# Patient Record
Sex: Female | Born: 1975
Health system: Southern US, Community
[De-identification: ages and names within clinical notes are randomized; demographics above are authoritative.]

## PROBLEM LIST (undated history)

## (undated) DIAGNOSIS — F32A Depression, unspecified: Secondary | ICD-10-CM

## (undated) DIAGNOSIS — R002 Palpitations: Secondary | ICD-10-CM

## (undated) DIAGNOSIS — Z8719 Personal history of other diseases of the digestive system: Secondary | ICD-10-CM

## (undated) DIAGNOSIS — E538 Deficiency of other specified B group vitamins: Secondary | ICD-10-CM

## (undated) DIAGNOSIS — N3010 Interstitial cystitis (chronic) without hematuria: Secondary | ICD-10-CM

## (undated) DIAGNOSIS — Z87442 Personal history of urinary calculi: Secondary | ICD-10-CM

## (undated) DIAGNOSIS — F329 Major depressive disorder, single episode, unspecified: Secondary | ICD-10-CM

## (undated) DIAGNOSIS — I6529 Occlusion and stenosis of unspecified carotid artery: Secondary | ICD-10-CM

## (undated) DIAGNOSIS — E669 Obesity, unspecified: Secondary | ICD-10-CM

## (undated) DIAGNOSIS — I999 Unspecified disorder of circulatory system: Secondary | ICD-10-CM

## (undated) DIAGNOSIS — K589 Irritable bowel syndrome without diarrhea: Secondary | ICD-10-CM

## (undated) DIAGNOSIS — K209 Esophagitis, unspecified without bleeding: Secondary | ICD-10-CM

## (undated) DIAGNOSIS — R531 Weakness: Secondary | ICD-10-CM

## (undated) DIAGNOSIS — K2940 Chronic atrophic gastritis without bleeding: Secondary | ICD-10-CM

## (undated) DIAGNOSIS — N83202 Unspecified ovarian cyst, left side: Secondary | ICD-10-CM

## (undated) DIAGNOSIS — R51 Headache: Secondary | ICD-10-CM

## (undated) DIAGNOSIS — G43909 Migraine, unspecified, not intractable, without status migrainosus: Secondary | ICD-10-CM

## (undated) DIAGNOSIS — I700 Atherosclerosis of aorta: Secondary | ICD-10-CM

## (undated) DIAGNOSIS — R29810 Facial weakness: Secondary | ICD-10-CM

## (undated) DIAGNOSIS — E785 Hyperlipidemia, unspecified: Secondary | ICD-10-CM

## (undated) DIAGNOSIS — K219 Gastro-esophageal reflux disease without esophagitis: Secondary | ICD-10-CM

## (undated) DIAGNOSIS — Z87898 Personal history of other specified conditions: Secondary | ICD-10-CM

## (undated) DIAGNOSIS — D510 Vitamin B12 deficiency anemia due to intrinsic factor deficiency: Secondary | ICD-10-CM

## (undated) HISTORY — PX: WISDOM TOOTH EXTRACTION: SHX21

## (undated) HISTORY — PX: TONSILLECTOMY: SUR1361

## (undated) HISTORY — DX: Chronic atrophic gastritis without bleeding: K29.40

## (undated) HISTORY — PX: TUBAL LIGATION: SHX77

## (undated) HISTORY — DX: Deficiency of other specified B group vitamins: E53.8

## (undated) HISTORY — DX: Headache: R51

## (undated) HISTORY — DX: Irritable bowel syndrome, unspecified: K58.9

---
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
## 2000-09-05 ENCOUNTER — Other Ambulatory Visit: Admission: RE | Admit: 2000-09-05 | Discharge: 2000-09-05 | Payer: Self-pay | Admitting: Otolaryngology

## 2001-01-24 ENCOUNTER — Ambulatory Visit (HOSPITAL_COMMUNITY): Admission: RE | Admit: 2001-01-24 | Discharge: 2001-01-24 | Payer: Self-pay | Admitting: Obstetrics and Gynecology

## 2001-01-24 ENCOUNTER — Encounter: Payer: Self-pay | Admitting: Obstetrics and Gynecology

## 2002-04-16 ENCOUNTER — Encounter: Payer: Self-pay | Admitting: Obstetrics and Gynecology

## 2002-04-16 ENCOUNTER — Ambulatory Visit (HOSPITAL_COMMUNITY): Admission: RE | Admit: 2002-04-16 | Discharge: 2002-04-16 | Payer: Self-pay | Admitting: Internal Medicine

## 2003-01-28 ENCOUNTER — Encounter: Payer: Self-pay | Admitting: Obstetrics and Gynecology

## 2003-01-28 ENCOUNTER — Ambulatory Visit (HOSPITAL_COMMUNITY): Admission: AD | Admit: 2003-01-28 | Discharge: 2003-01-28 | Payer: Self-pay | Admitting: Obstetrics and Gynecology

## 2003-01-31 ENCOUNTER — Ambulatory Visit (HOSPITAL_COMMUNITY): Admission: AD | Admit: 2003-01-31 | Discharge: 2003-01-31 | Payer: Self-pay | Admitting: Obstetrics and Gynecology

## 2003-02-04 ENCOUNTER — Ambulatory Visit (HOSPITAL_COMMUNITY): Admission: AD | Admit: 2003-02-04 | Discharge: 2003-02-04 | Payer: Self-pay | Admitting: Obstetrics and Gynecology

## 2003-02-07 ENCOUNTER — Ambulatory Visit (HOSPITAL_COMMUNITY): Admission: AD | Admit: 2003-02-07 | Discharge: 2003-02-07 | Payer: Self-pay | Admitting: Obstetrics and Gynecology

## 2003-02-11 ENCOUNTER — Ambulatory Visit (HOSPITAL_COMMUNITY): Admission: AD | Admit: 2003-02-11 | Discharge: 2003-02-11 | Payer: Self-pay | Admitting: Obstetrics and Gynecology

## 2003-02-14 ENCOUNTER — Ambulatory Visit (HOSPITAL_COMMUNITY): Admission: AD | Admit: 2003-02-14 | Discharge: 2003-02-14 | Payer: Self-pay | Admitting: Obstetrics and Gynecology

## 2003-02-19 ENCOUNTER — Ambulatory Visit (HOSPITAL_COMMUNITY): Admission: AD | Admit: 2003-02-19 | Discharge: 2003-02-19 | Payer: Self-pay | Admitting: Obstetrics and Gynecology

## 2003-02-22 ENCOUNTER — Ambulatory Visit (HOSPITAL_COMMUNITY): Admission: AD | Admit: 2003-02-22 | Discharge: 2003-02-22 | Payer: Self-pay | Admitting: Obstetrics and Gynecology

## 2003-02-26 ENCOUNTER — Ambulatory Visit (HOSPITAL_COMMUNITY): Admission: AD | Admit: 2003-02-26 | Discharge: 2003-02-26 | Payer: Self-pay | Admitting: Obstetrics and Gynecology

## 2003-03-01 ENCOUNTER — Ambulatory Visit (HOSPITAL_COMMUNITY): Admission: AD | Admit: 2003-03-01 | Discharge: 2003-03-01 | Payer: Self-pay | Admitting: Obstetrics and Gynecology

## 2003-03-05 ENCOUNTER — Ambulatory Visit (HOSPITAL_COMMUNITY): Admission: AD | Admit: 2003-03-05 | Discharge: 2003-03-05 | Payer: Self-pay | Admitting: Obstetrics and Gynecology

## 2003-03-07 ENCOUNTER — Ambulatory Visit (HOSPITAL_COMMUNITY): Admission: AD | Admit: 2003-03-07 | Discharge: 2003-03-07 | Payer: Self-pay | Admitting: Obstetrics and Gynecology

## 2003-03-08 ENCOUNTER — Ambulatory Visit (HOSPITAL_COMMUNITY): Admission: AD | Admit: 2003-03-08 | Discharge: 2003-03-08 | Payer: Self-pay | Admitting: Obstetrics and Gynecology

## 2003-03-11 ENCOUNTER — Ambulatory Visit (HOSPITAL_COMMUNITY): Admission: AD | Admit: 2003-03-11 | Discharge: 2003-03-11 | Payer: Self-pay | Admitting: Obstetrics and Gynecology

## 2003-03-15 ENCOUNTER — Ambulatory Visit (HOSPITAL_COMMUNITY): Admission: AD | Admit: 2003-03-15 | Discharge: 2003-03-15 | Payer: Self-pay | Admitting: Obstetrics and Gynecology

## 2003-03-19 ENCOUNTER — Ambulatory Visit (HOSPITAL_COMMUNITY): Admission: AD | Admit: 2003-03-19 | Discharge: 2003-03-19 | Payer: Self-pay | Admitting: Obstetrics and Gynecology

## 2003-03-21 ENCOUNTER — Ambulatory Visit (HOSPITAL_COMMUNITY): Admission: AD | Admit: 2003-03-21 | Discharge: 2003-03-21 | Payer: Self-pay | Admitting: Obstetrics and Gynecology

## 2003-03-26 ENCOUNTER — Ambulatory Visit (HOSPITAL_COMMUNITY): Admission: AD | Admit: 2003-03-26 | Discharge: 2003-03-26 | Payer: Self-pay | Admitting: Obstetrics and Gynecology

## 2003-03-29 ENCOUNTER — Ambulatory Visit (HOSPITAL_COMMUNITY): Admission: AD | Admit: 2003-03-29 | Discharge: 2003-03-29 | Payer: Self-pay | Admitting: Obstetrics and Gynecology

## 2003-04-02 ENCOUNTER — Inpatient Hospital Stay (HOSPITAL_COMMUNITY): Admission: AD | Admit: 2003-04-02 | Discharge: 2003-04-05 | Payer: Self-pay | Admitting: Obstetrics and Gynecology

## 2003-05-02 ENCOUNTER — Ambulatory Visit (HOSPITAL_COMMUNITY): Admission: RE | Admit: 2003-05-02 | Discharge: 2003-05-02 | Payer: Self-pay | Admitting: Obstetrics and Gynecology

## 2003-07-06 HISTORY — PX: ABDOMINAL HYSTERECTOMY: SHX81

## 2003-07-11 ENCOUNTER — Ambulatory Visit (HOSPITAL_COMMUNITY): Admission: RE | Admit: 2003-07-11 | Discharge: 2003-07-11 | Payer: Self-pay | Admitting: Obstetrics & Gynecology

## 2003-09-05 ENCOUNTER — Inpatient Hospital Stay (HOSPITAL_COMMUNITY): Admission: RE | Admit: 2003-09-05 | Discharge: 2003-09-07 | Payer: Self-pay | Admitting: Obstetrics & Gynecology

## 2006-08-10 ENCOUNTER — Ambulatory Visit (HOSPITAL_COMMUNITY): Admission: RE | Admit: 2006-08-10 | Discharge: 2006-08-10 | Payer: Self-pay | Admitting: General Surgery

## 2006-10-21 ENCOUNTER — Ambulatory Visit (HOSPITAL_COMMUNITY): Admission: RE | Admit: 2006-10-21 | Discharge: 2006-10-21 | Payer: Self-pay | Admitting: Obstetrics and Gynecology

## 2011-10-21 ENCOUNTER — Other Ambulatory Visit (HOSPITAL_COMMUNITY): Payer: Self-pay | Admitting: Family Medicine

## 2011-10-21 ENCOUNTER — Ambulatory Visit (HOSPITAL_COMMUNITY)
Admission: RE | Admit: 2011-10-21 | Discharge: 2011-10-21 | Disposition: A | Discharge status: Home / Self Care | Payer: Managed Care, Other (non HMO) | Source: Ambulatory Visit | Attending: Family Medicine | Admitting: Family Medicine

## 2011-10-21 DIAGNOSIS — R079 Chest pain, unspecified: Secondary | ICD-10-CM

## 2012-02-27 ENCOUNTER — Emergency Department (HOSPITAL_COMMUNITY): Payer: Managed Care, Other (non HMO)

## 2012-02-27 ENCOUNTER — Inpatient Hospital Stay (HOSPITAL_COMMUNITY)
Admission: EM | Admit: 2012-02-27 | Discharge: 2012-03-01 | DRG: 392 | Disposition: A | Discharge status: Home / Self Care | Payer: Managed Care, Other (non HMO) | Attending: Internal Medicine | Admitting: Internal Medicine

## 2012-02-27 ENCOUNTER — Encounter (HOSPITAL_COMMUNITY): Payer: Self-pay | Admitting: Emergency Medicine

## 2012-02-27 DIAGNOSIS — Z800 Family history of malignant neoplasm of digestive organs: Secondary | ICD-10-CM

## 2012-02-27 DIAGNOSIS — F17200 Nicotine dependence, unspecified, uncomplicated: Secondary | ICD-10-CM | POA: Diagnosis present

## 2012-02-27 DIAGNOSIS — Z720 Tobacco use: Secondary | ICD-10-CM | POA: Diagnosis present

## 2012-02-27 DIAGNOSIS — Z90710 Acquired absence of both cervix and uterus: Secondary | ICD-10-CM

## 2012-02-27 DIAGNOSIS — E669 Obesity, unspecified: Secondary | ICD-10-CM | POA: Diagnosis present

## 2012-02-27 DIAGNOSIS — K219 Gastro-esophageal reflux disease without esophagitis: Secondary | ICD-10-CM | POA: Diagnosis present

## 2012-02-27 DIAGNOSIS — K5289 Other specified noninfective gastroenteritis and colitis: Secondary | ICD-10-CM

## 2012-02-27 DIAGNOSIS — E785 Hyperlipidemia, unspecified: Secondary | ICD-10-CM | POA: Diagnosis present

## 2012-02-27 DIAGNOSIS — K529 Noninfective gastroenteritis and colitis, unspecified: Secondary | ICD-10-CM | POA: Diagnosis present

## 2012-02-27 DIAGNOSIS — Z6832 Body mass index (BMI) 32.0-32.9, adult: Secondary | ICD-10-CM

## 2012-02-27 DIAGNOSIS — A09 Infectious gastroenteritis and colitis, unspecified: Principal | ICD-10-CM | POA: Diagnosis present

## 2012-02-27 HISTORY — DX: Hyperlipidemia, unspecified: E78.5

## 2012-02-27 HISTORY — DX: Gastro-esophageal reflux disease without esophagitis: K21.9

## 2012-02-27 LAB — CBC WITH DIFFERENTIAL/PLATELET
Basophils Absolute: 0 K/uL (ref 0.0–0.1)
Basophils Relative: 0 % (ref 0–1)
Eosinophils Absolute: 0 K/uL (ref 0.0–0.7)
Eosinophils Relative: 0 % (ref 0–5)
HCT: 46.5 % — ABNORMAL HIGH (ref 36.0–46.0)
Hemoglobin: 16.4 g/dL — ABNORMAL HIGH (ref 12.0–15.0)
Lymphocytes Relative: 18 % (ref 12–46)
Lymphs Abs: 1.8 K/uL (ref 0.7–4.0)
MCH: 32.5 pg (ref 26.0–34.0)
MCHC: 35.3 g/dL (ref 30.0–36.0)
MCV: 92.1 fL (ref 78.0–100.0)
Monocytes Absolute: 0.6 K/uL (ref 0.1–1.0)
Monocytes Relative: 6 % (ref 3–12)
Neutro Abs: 7.7 K/uL (ref 1.7–7.7)
Neutrophils Relative %: 77 % (ref 43–77)
Platelets: 184 K/uL (ref 150–400)
RBC: 5.05 MIL/uL (ref 3.87–5.11)
RDW: 12.4 % (ref 11.5–15.5)
WBC: 10.1 K/uL (ref 4.0–10.5)

## 2012-02-27 LAB — LIPASE, BLOOD: Lipase: 18 U/L (ref 11–59)

## 2012-02-27 LAB — COMPREHENSIVE METABOLIC PANEL WITH GFR
ALT: 12 U/L (ref 0–35)
AST: 14 U/L (ref 0–37)
Albumin: 4.2 g/dL (ref 3.5–5.2)
Alkaline Phosphatase: 84 U/L (ref 39–117)
BUN: 4 mg/dL — ABNORMAL LOW (ref 6–23)
CO2: 26 meq/L (ref 19–32)
Calcium: 10.3 mg/dL (ref 8.4–10.5)
Chloride: 98 meq/L (ref 96–112)
Creatinine, Ser: 0.71 mg/dL (ref 0.50–1.10)
GFR calc Af Amer: >90 mL/min
GFR calc non Af Amer: >90 mL/min
Glucose, Bld: 97 mg/dL (ref 70–99)
Potassium: 3.5 meq/L (ref 3.5–5.1)
Sodium: 134 meq/L — ABNORMAL LOW (ref 135–145)
Total Bilirubin: 0.4 mg/dL (ref 0.3–1.2)
Total Protein: 8.1 g/dL (ref 6.0–8.3)

## 2012-02-27 LAB — BILIRUBIN, DIRECT: Bilirubin, Direct: 0.1 mg/dL (ref 0.0–0.3)

## 2012-02-27 LAB — CLOSTRIDIUM DIFFICILE BY PCR: Toxigenic C. Difficile by PCR: NEGATIVE

## 2012-02-27 MED ORDER — SODIUM CHLORIDE 0.9 % IV SOLN
1000.0000 mL | Freq: Once | INTRAVENOUS | Status: AC
  Administered 2012-02-27: 1000 mL via INTRAVENOUS

## 2012-02-27 MED ORDER — ENOXAPARIN SODIUM 40 MG/0.4ML ~~LOC~~ SOLN
40.0000 mg | SUBCUTANEOUS | Status: DC
  Administered 2012-02-27: 40 mg via SUBCUTANEOUS
  Filled 2012-02-27 (×2): qty 0.4

## 2012-02-27 MED ORDER — ACETAMINOPHEN 325 MG PO TABS
650.0000 mg | ORAL_TABLET | Freq: Once | ORAL | Status: AC
  Administered 2012-02-27: 650 mg via ORAL
  Filled 2012-02-27: qty 2

## 2012-02-27 MED ORDER — ONDANSETRON HCL 4 MG/2ML IJ SOLN
4.0000 mg | Freq: Once | INTRAMUSCULAR | Status: AC
  Administered 2012-02-27: 4 mg via INTRAVENOUS
  Filled 2012-02-27: qty 2

## 2012-02-27 MED ORDER — TRAZODONE HCL 50 MG PO TABS: 25.0000 mg | ORAL_TABLET | Freq: Every evening | ORAL | Status: DC | PRN

## 2012-02-27 MED ORDER — POTASSIUM CHLORIDE IN NACL 20-0.9 MEQ/L-% IV SOLN
INTRAVENOUS | Status: DC
  Administered 2012-02-27 – 2012-02-29 (×3): via INTRAVENOUS
  Administered 2012-02-29 – 2012-03-01 (×2): 1000 mL via INTRAVENOUS

## 2012-02-27 MED ORDER — SODIUM CHLORIDE 0.9 % IV SOLN
1000.0000 mL | INTRAVENOUS | Status: DC
  Administered 2012-02-27: 1000 mL via INTRAVENOUS

## 2012-02-27 MED ORDER — CIPROFLOXACIN IN D5W 400 MG/200ML IV SOLN
400.0000 mg | Freq: Once | INTRAVENOUS | Status: AC
  Administered 2012-02-27: 400 mg via INTRAVENOUS
  Filled 2012-02-27: qty 200

## 2012-02-27 MED ORDER — SODIUM CHLORIDE 0.9 % IV SOLN: INTRAVENOUS | Status: DC

## 2012-02-27 MED ORDER — ONDANSETRON HCL 4 MG/2ML IJ SOLN
4.0000 mg | INTRAMUSCULAR | Status: DC | PRN
  Administered 2012-02-28: 4 mg via INTRAVENOUS
  Filled 2012-02-27: qty 2

## 2012-02-27 MED ORDER — ONDANSETRON HCL 4 MG/2ML IJ SOLN: 4.0000 mg | Freq: Three times a day (TID) | INTRAMUSCULAR | Status: DC | PRN

## 2012-02-27 MED ORDER — ACETAMINOPHEN 650 MG RE SUPP: 650.0000 mg | Freq: Four times a day (QID) | RECTAL | Status: DC | PRN

## 2012-02-27 MED ORDER — ACETAMINOPHEN 325 MG PO TABS
650.0000 mg | ORAL_TABLET | ORAL | Status: DC | PRN
  Administered 2012-02-28 – 2012-02-29 (×2): 650 mg via ORAL
  Filled 2012-02-27 (×2): qty 2

## 2012-02-27 MED ORDER — HYDROMORPHONE HCL PF 1 MG/ML IJ SOLN
1.0000 mg | Freq: Once | INTRAMUSCULAR | Status: AC
  Administered 2012-02-27: 1 mg via INTRAVENOUS
  Filled 2012-02-27: qty 1

## 2012-02-27 MED ORDER — IOHEXOL 300 MG/ML  SOLN
100.0000 mL | Freq: Once | INTRAMUSCULAR | Status: AC | PRN
  Administered 2012-02-27: 100 mL via INTRAVENOUS

## 2012-02-27 MED ORDER — METRONIDAZOLE IN NACL 5-0.79 MG/ML-% IV SOLN
500.0000 mg | Freq: Three times a day (TID) | INTRAVENOUS | Status: DC
  Administered 2012-02-28 – 2012-02-29 (×4): 500 mg via INTRAVENOUS
  Filled 2012-02-27 (×5): qty 100

## 2012-02-27 MED ORDER — HYDROMORPHONE HCL PF 1 MG/ML IJ SOLN
0.5000 mg | INTRAMUSCULAR | Status: DC | PRN
  Administered 2012-02-27 – 2012-02-29 (×7): 1 mg via INTRAVENOUS
  Filled 2012-02-27 (×7): qty 1

## 2012-02-27 MED ORDER — CIPROFLOXACIN IN D5W 400 MG/200ML IV SOLN
400.0000 mg | Freq: Two times a day (BID) | INTRAVENOUS | Status: DC
  Administered 2012-02-28 (×2): 400 mg via INTRAVENOUS
  Filled 2012-02-27 (×3): qty 200

## 2012-02-27 MED ORDER — PANTOPRAZOLE SODIUM 40 MG PO TBEC
40.0000 mg | DELAYED_RELEASE_TABLET | Freq: Every day | ORAL | Status: DC
  Administered 2012-02-27 – 2012-02-29 (×3): 40 mg via ORAL
  Filled 2012-02-27 (×3): qty 1

## 2012-02-27 MED ORDER — METRONIDAZOLE IN NACL 5-0.79 MG/ML-% IV SOLN
500.0000 mg | Freq: Once | INTRAVENOUS | Status: AC
  Administered 2012-02-27: 500 mg via INTRAVENOUS
  Filled 2012-02-27: qty 100

## 2012-02-27 NOTE — ED Notes (Signed)
Pt c/o lower abdominal pain, diarrhea, and nausea since Thursday. Pt describes pain as "severe cramping". Pt also presents with fever but states she was unaware of fever before arrival. Pt states  Her son had similar symptoms 1 week ago.

## 2012-02-27 NOTE — H&P (Signed)
[facility] History and Physical  Vanessa Lamb  EAV:[id number]  DOB: 06-Jan-1976   DOA: 02/27/2012   PCP:   Cassell Smiles., MD   Chief Complaint:  Cramping abdominal pains and diarrhea for 2 days  HPI: Vanessa Lamb is an 36 y.o. female.   Obese Caucasian lady with self diagnosed GERD, but no other past gastrointestinal history since with the above symptoms. Diarrhea is now become blood streaked. She has had no recent travel, no household contacts with diarrhea, and because the pain has gotten significantly worse he presents to the emergency room for evaluation.  CT scan in the emergency room shows inflammation of descending and sigmoid colon; rectal involvement is not described.   Rewiew of Systems:   All systems negative except as marked bold or noted in the HPI;  Constitutional: Negative for malaise, fever and chills. ;  Eyes: Negative for eye pain, redness and discharge. ;  ENMT: Negative for ear pain, hoarseness, nasal congestion, sinus pressure and sore throat. ;  Cardiovascular: Negative for chest pain, palpitations, diaphoresis, dyspnea and peripheral edema. ;  Respiratory: Negative for cough, hemoptysis, wheezing and stridor. ;  Gastrointestinal: Negative for  vomiting, , constipation,, hematemesis, jaundice and rectal bleeding. unusual weight loss..melena; nausea,diarrhea blood in stool,abdominal pain.    Genitourinary: Negative for frequency, dysuria, incontinence,flank pain and hematuria; Musculoskeletal: Negative for back pain and neck pain. Negative for swelling and trauma.;  Skin: . Negative for pruritus, rash, abrasions, bruising and skin lesion.; ulcerations Neuro: Negative for headache, lightheadedness and neck stiffness. Negative for weakness, altered level of consciousness , altered mental status, extremity weakness, burning feet, involuntary movement, seizure and syncope.  Psych: negative for anxiety, depression, insomnia, tearfulness, panic attacks,  hallucinations, paranoia, suicidal or homicidal ideation.   Past Medical History  Diagnosis Date  . Hyperlipemia   . GERD (gastroesophageal reflux disease)     Past Surgical History  Procedure Date  . Abdominal hysterectomy   . Tubal ligation   . Wisdom tooth extraction     Medications:  HOME MEDS: Prior to Admission medications   Not on File  Occasional takes Rolaids   Allergies:  No Known Allergies  Social History:   reports that she has been smoking.  She does not have any smokeless tobacco history on file. She reports that she does not drink alcohol or use illicit drugs. smokes one pack per day for an average of 10 years  Family History: Family History  Problem Relation Age of Onset  . Hypertension Mother   . Hypertension Father   . Cancer Maternal Grandfather     colon  . Ulcers Father     PUD  . Colonic polyp Son 9     Physical Exam: Filed Vitals:   02/27/12 1732 02/27/12 2008  BP: 126/85 116/71  Pulse: 137 111  Temp: 102 F (38.9 C) 100.5 F (38.1 C)  TempSrc: Oral Oral  Resp: 18 20  Height: 5\' 4"  (1.626 m)   Weight: 81.647 kg (180 lb)   SpO2: 99% 99%   Blood pressure 116/71, pulse 111, temperature 100.5 F (38.1 C), temperature source Oral, resp. rate 20, height 5\' 4"  (1.626 m), weight 81.647 kg (180 lb), SpO2 99.00%.  GEN:  Pleasant overweight Caucasian lady lying in the stretcher distressed by abdominal pain; cooperative with exam PSYCH:  alert and oriented x4;  affect is appropriate. HEENT: Mucous membranes pink, dry, and anicteric; PERRLA; EOM intact; no cervical lymphadenopathy nor thyromegaly or carotid bruit; no JVD; Breasts::  Not examined CHEST WALL: No tenderness CHEST: Normal respiration, clear to auscultation bilaterally HEART: Regular rate and rhythm; no murmurs rubs or gallops BACK: No kyphosis or scoliosis; no CVA tenderness ABDOMEN: Obese, soft diffuse abdominal tenderness; no masses, no organomegaly, normal abdominal bowel  sounds; ; no intertriginous candida. Rectal Exam: Not done EXTREMITIES: No bone or joint deformity; no edema; no ulcerations. Genitalia: not examined PULSES: 2+ and symmetric SKIN: Normal hydration no rash or ulceration CNS: Cranial nerves 2-12 grossly intact no focal lateralizing neurologic deficit   Labs on Admission:  Basic Metabolic Panel:  Lab 02/27/12 1610  NA 134*  K 3.5  CL 98  CO2 26  GLUCOSE 97  BUN 4*  CREATININE 0.71  CALCIUM 10.3  MG --  PHOS --   Liver Function Tests:  Lab 02/27/12 1755  AST 14  ALT 12  ALKPHOS 84  BILITOT 0.4  PROT 8.1  ALBUMIN 4.2    Lab 02/27/12 1755  LIPASE 18  AMYLASE --   No results found for this basename: AMMONIA:5 in the last 168 hours CBC:  Lab 02/27/12 1755  WBC 10.1  NEUTROABS 7.7  HGB 16.4*  HCT 46.5*  MCV 92.1  PLT 184   Cardiac Enzymes: No results found for this basename: CKTOTAL:5,CKMB:5,CKMBINDEX:5,TROPONINI:5 in the last 168 hours BNP: No components found with this basename: POCBNP:5 CBG: No results found for this basename: GLUCAP:5 in the last 168 hours  Results for orders placed during the hospital encounter of 02/27/12 (from the past 48 hour(s))  COMPREHENSIVE METABOLIC PANEL     Status: Abnormal   Collection Time   02/27/12  5:55 PM      Component Value Range Comment   Sodium 134 (*) 135 - 145 mEq/L    Potassium 3.5  3.5 - 5.1 mEq/L    Chloride 98  96 - 112 mEq/L    CO2 26  19 - 32 mEq/L    Glucose, Bld 97  70 - 99 mg/dL    BUN 4 (*) 6 - 23 mg/dL    Creatinine, Ser 9.60  0.50 - 1.10 mg/dL    Calcium 45.4  8.4 - 10.5 mg/dL    Total Protein 8.1  6.0 - 8.3 g/dL    Albumin 4.2  3.5 - 5.2 g/dL    AST 14  0 - 37 U/L    ALT 12  0 - 35 U/L    Alkaline Phosphatase 84  39 - 117 U/L    Total Bilirubin 0.4  0.3 - 1.2 mg/dL    GFR calc non Af Amer >90  >90 mL/min    GFR calc Af Amer >90  >90 mL/min   CBC WITH DIFFERENTIAL     Status: Abnormal   Collection Time   02/27/12  5:55 PM      Component Value  Range Comment   WBC 10.1  4.0 - 10.5 K/uL    RBC 5.05  3.87 - 5.11 MIL/uL    Hemoglobin 16.4 (*) 12.0 - 15.0 g/dL    HCT 09.8 (*) 11.9 - 46.0 %    MCV 92.1  78.0 - 100.0 fL    MCH 32.5  26.0 - 34.0 pg    MCHC 35.3  30.0 - 36.0 g/dL    RDW 14.7  82.9 - 56.2 %    Platelets 184  150 - 400 K/uL    Neutrophils Relative 77  43 - 77 %    Neutro Abs 7.7  1.7 - 7.7 K/uL  Lymphocytes Relative 18  12 - 46 %    Lymphs Abs 1.8  0.7 - 4.0 K/uL    Monocytes Relative 6  3 - 12 %    Monocytes Absolute 0.6  0.1 - 1.0 K/uL    Eosinophils Relative 0  0 - 5 %    Eosinophils Absolute 0.0  0.0 - 0.7 K/uL    Basophils Relative 0  0 - 1 %    Basophils Absolute 0.0  0.0 - 0.1 K/uL   LIPASE, BLOOD     Status: Normal   Collection Time   02/27/12  5:55 PM      Component Value Range Comment   Lipase 18  11 - 59 U/L      Radiological Exams on Admission: Ct Abdomen Pelvis W Contrast  02/27/2012  *RADIOLOGY REPORT*  Clinical Data: Lower abdominal pain and diarrhea for 3 days.  CT ABDOMEN AND PELVIS WITH CONTRAST  Technique:  Multidetector CT imaging of the abdomen and pelvis was performed following the standard protocol during bolus administration of intravenous contrast.  Contrast: OMNIPAQUE IOHEXOL 300 MG/ML  SOLN  Comparison: Renal ultrasound 11/03/2010.  Findings: Mild dependent atelectasis is seen in the lung bases.  No pleural or pericardial effusion.  The liver, gallbladder, adrenal glands, spleen, pancreas and left kidney are unremarkable.  Small low attenuating lesion in the lower pole right kidney is compatible with a cyst and as seen on the comparison study.  The patient is status post hysterectomy.  The stomach and small bowel are unremarkable.  The colon is largely decompressed with the walls of the descending and sigmoid colon thickened with mild pericolonic inflammatory change.  No pneumatosis, portal venous gas, free air or fluid collection is identified.  IMPRESSION: Findings consistent with  colitis of the descending and sigmoid, either infectious or inflammatory.   Original Report Authenticated By: Bernadene Bell. Maricela Curet, M.D.      Assessment/Plan Present on Admission:  .Acute colitis .Hyperlipidemia .Obesity .GERD (gastroesophageal reflux disease) Tobacco abuse  PLAN: We'll admit this lady for hydration, and intravenous antibiotic treatment for presumed infectious colitis; We'll also check a C. Difficile, although she has no risk factors, and because of the current high prevalence in the community.  Consul on weight reduction and tobacco cessation. Counsel on medication compliance -since she reports noncompliance with Zocor  Other plans as per orders.  Code Status: FULL CODE  Family Communication: Mother, Vanessa Lamb, [phone] Rexene Edison [phone]) Disposition Plan:     Vanessa Lamb Nocturnist [facility] Pager [phone]   02/27/2012, 8:40 PM

## 2012-02-27 NOTE — ED Provider Notes (Signed)
History     CSN: [id number]  Arrival date & time 02/27/12  1701   First MD Initiated Contact with Patient 02/27/12 1743      Chief Complaint  Patient presents with  . Abdominal Pain  . Diarrhea    (Consider location/radiation/quality/duration/timing/severity/associated sxs/prior treatment) HPI Patient with diarrhea followed by abdominal pain and fever for two days.  Nausea but patient denies vomiting. She describes copious diarrhea which has now taken on some pinkish tinge. She is also complaining of severe lower abdominal cramping. She has not been out of the country, camping, or had any known sick contacts. Past Medical History  Diagnosis Date  . Hyperlipemia     Past Surgical History  Procedure Date  . Abdominal hysterectomy   . Tubal ligation   . Wisdom tooth extraction     No family history on file.  History  Substance Use Topics  . Smoking status: Never Smoker   . Smokeless tobacco: Not on file  . Alcohol Use: No    OB History    Grav Para Term Preterm Abortions TAB SAB Ect Mult Living                  Review of Systems  All other systems reviewed and are negative.    Allergies  Review of patient's allergies indicates no known allergies.  Home Medications  No current outpatient prescriptions on file.  BP 126/85  Pulse 137  Temp 102 F (38.9 C) (Oral)  Resp 18  Ht 5\' 4"  (1.626 m)  Wt 180 lb (81.647 kg)  BMI 30.90 kg/m2  SpO2 99%  Physical Exam  Nursing note and vitals reviewed. Constitutional: She appears well-developed and well-nourished.  HENT:  Head: Normocephalic and atraumatic.  Eyes: Conjunctivae and EOM are normal. Pupils are equal, round, and reactive to light.  Neck: Normal range of motion. Neck supple.  Cardiovascular: Regular rhythm, normal heart sounds and intact distal pulses.  Tachycardia present.   Pulmonary/Chest: Effort normal and breath sounds normal.  Abdominal: Soft. Bowel sounds are normal.       Left lower  abdominal pain and suprapubic tenderness.  Musculoskeletal: Normal range of motion.  Neurological: She is alert.  Skin: Skin is warm and dry.  Psychiatric: She has a normal mood and affect. Thought content normal.    ED Course  Procedures (including critical care time)  Labs Reviewed  COMPREHENSIVE METABOLIC PANEL - Abnormal; Notable for the following:    Sodium 134 (*)     BUN 4 (*)     All other components within normal limits  CBC WITH DIFFERENTIAL - Abnormal; Notable for the following:    Hemoglobin 16.4 (*)     HCT 46.5 (*)     All other components within normal limits  LIPASE, BLOOD   Ct Abdomen Pelvis W Contrast  02/27/2012  *RADIOLOGY REPORT*  Clinical Data: Lower abdominal pain and diarrhea for 3 days.  CT ABDOMEN AND PELVIS WITH CONTRAST  Technique:  Multidetector CT imaging of the abdomen and pelvis was performed following the standard protocol during bolus administration of intravenous contrast.  Contrast: OMNIPAQUE IOHEXOL 300 MG/ML  SOLN  Comparison: Renal ultrasound 11/03/2010.  Findings: Mild dependent atelectasis is seen in the lung bases.  No pleural or pericardial effusion.  The liver, gallbladder, adrenal glands, spleen, pancreas and left kidney are unremarkable.  Small low attenuating lesion in the lower pole right kidney is compatible with a cyst and as seen on the comparison study.  The patient is status post hysterectomy.  The stomach and small bowel are unremarkable.  The colon is largely decompressed with the walls of the descending and sigmoid colon thickened with mild pericolonic inflammatory change.  No pneumatosis, portal venous gas, free air or fluid collection is identified.  IMPRESSION: Findings consistent with colitis of the descending and sigmoid, either infectious or inflammatory.   Original Report Authenticated By: Bernadene Bell. Maricela Curet, M.D.      No diagnosis found.  The patient received 2 L of normal saline, Tylenol, a milligram of Dilaudid, and  Zofran. After review of her CT scan she also received Cipro and Flagyl.  MDM  Patient continues to complain of diarrhea. She was initially very tachycardic and febrile. Plan admission for continued hydration and antibiotic treatment.        Hilario Quarry, MD 03/01/12 [phone]

## 2012-02-27 NOTE — ED Notes (Signed)
Patient with c/o of diarrhea, fever, and vomiting x 2 days. Patient c/o lower abdominal cramping.

## 2012-02-28 ENCOUNTER — Encounter (HOSPITAL_COMMUNITY): Payer: Self-pay

## 2012-02-28 DIAGNOSIS — Z720 Tobacco use: Secondary | ICD-10-CM | POA: Diagnosis present

## 2012-02-28 LAB — CBC
Hemoglobin: 12.9 g/dL (ref 12.0–15.0)
MCH: 31.8 pg (ref 26.0–34.0)
MCHC: 34.3 g/dL (ref 30.0–36.0)
Platelets: 150 10*3/uL (ref 150–400)
RBC: 4.03 MIL/uL (ref 3.87–5.11)

## 2012-02-28 LAB — BASIC METABOLIC PANEL
Calcium: 8.7 mg/dL (ref 8.4–10.5)
GFR calc Af Amer: >90 mL/min (ref >90)
GFR calc non Af Amer: >90 mL/min (ref >90)
Glucose, Bld: 103 mg/dL — ABNORMAL HIGH (ref 70–99)
Potassium: 3.7 mEq/L (ref 3.5–5.1)
Sodium: 138 mEq/L (ref 135–145)

## 2012-02-28 MED ORDER — NICOTINE 21 MG/24HR TD PT24
21.0000 mg | MEDICATED_PATCH | Freq: Every day | TRANSDERMAL | Status: DC | PRN
  Administered 2012-02-29: 21 mg via TRANSDERMAL
  Filled 2012-02-28: qty 1

## 2012-02-28 MED ORDER — METRONIDAZOLE IN NACL 5-0.79 MG/ML-% IV SOLN
INTRAVENOUS | Status: AC
  Filled 2012-02-28: qty 100

## 2012-02-28 MED ORDER — SODIUM CHLORIDE 0.9 % IJ SOLN
INTRAMUSCULAR | Status: AC
  Filled 2012-02-28: qty 3

## 2012-02-28 NOTE — Progress Notes (Signed)
UR Chart Review Completed  

## 2012-02-28 NOTE — Progress Notes (Signed)
Subjective: This lady was admitted with presumed infectious colitis, descending colon. She feels somewhat better. She is still having diarrhea, no nausea or vomiting. Abdominal pain is not particularly impressive. C. difficile toxin is negative. She is on intravenous antibiotics.           Physical Exam: Blood pressure 111/81, pulse 115, temperature 98.3 F (36.8 C), temperature source Oral, resp. rate 20, height 5\' 4"  (1.626 m), weight 81.647 kg (180 lb), SpO2 99.00%. She looks systemically well. She is not toxic or septic. Her abdomen is soft and mildly tender in the left lower quadrant. Bowel sounds are heard. Heart sounds are present and normal. Lung fields are clear. She is alert and orientated.   Investigations:  Recent Results (from the past 240 hour(s))  CLOSTRIDIUM DIFFICILE BY PCR     Status: Normal   Collection Time   02/27/12  8:14 PM      Component Value Range Status Comment   C difficile by pcr NEGATIVE  NEGATIVE Final      Basic Metabolic Panel:  Basename 02/28/12 0512 02/27/12 1755  NA 138 134*  K 3.7 3.5  CL 107 98  CO2 23 26  GLUCOSE 103* 97  BUN 5* 4*  CREATININE 0.82 0.71  CALCIUM 8.7 10.3  MG 1.7 --  PHOS -- --   Liver Function Tests:  Basename 02/27/12 1755  AST 14  ALT 12  ALKPHOS 84  BILITOT 0.4  PROT 8.1  ALBUMIN 4.2     CBC:  Basename 02/28/12 0512 02/27/12 1755  WBC 7.8 10.1  NEUTROABS -- 7.7  HGB 12.9 16.4*  HCT 37.3 46.5*  MCV 92.6 92.1  PLT 150 184    Ct Abdomen Pelvis W Contrast  02/27/2012  *RADIOLOGY REPORT*  Clinical Data: Lower abdominal pain and diarrhea for 3 days.  CT ABDOMEN AND PELVIS WITH CONTRAST  Technique:  Multidetector CT imaging of the abdomen and pelvis was performed following the standard protocol during bolus administration of intravenous contrast.  Contrast: OMNIPAQUE IOHEXOL 300 MG/ML  SOLN  Comparison: Renal ultrasound 11/03/2010.  Findings: Mild dependent atelectasis is seen in the  lung bases.  No pleural or pericardial effusion.  The liver, gallbladder, adrenal glands, spleen, pancreas and left kidney are unremarkable.  Small low attenuating lesion in the lower pole right kidney is compatible with a cyst and as seen on the comparison study.  The patient is status post hysterectomy.  The stomach and small bowel are unremarkable.  The colon is largely decompressed with the walls of the descending and sigmoid colon thickened with mild pericolonic inflammatory change.  No pneumatosis, portal venous gas, free air or fluid collection is identified.  IMPRESSION: Findings consistent with colitis of the descending and sigmoid, either infectious or inflammatory.   Original Report Authenticated By: Bernadene Bell. Maricela Curet, M.D.       Medications:  Scheduled:   . sodium chloride  1,000 mL Intravenous Once   Followed by  . sodium chloride  1,000 mL Intravenous Once  . acetaminophen  650 mg Oral Once  . ciprofloxacin  400 mg Intravenous Once  . ciprofloxacin  400 mg Intravenous Q12H  . enoxaparin (LOVENOX) injection  40 mg Subcutaneous Q24H  .  HYDROmorphone (DILAUDID) injection  1 mg Intravenous Once  . metronidazole  500 mg Intravenous Once  . metronidazole  500 mg Intravenous Q8H  . ondansetron (ZOFRAN) IV  4 mg Intravenous Once  . pantoprazole  40 mg Oral Q1200  .  DISCONTD: sodium chloride   Intravenous STAT    Impression: 1. Presumed infectious descending colitis. 2. Obesity. 3. History of GERD.     Plan: 1. Continue with intravenous antibiotics. 2. Continue intravenous fluids. 3. Advance diet as tolerated. 4. Hopefully, she can be discharged home soon.     LOS: 1 day   Wilson Singer Pager [phone]  02/28/2012, 7:39 AM

## 2012-02-28 NOTE — Care Management Note (Signed)
    Page 1 of 1   03/01/2012     10:39:12 AM   CARE MANAGEMENT NOTE 03/01/2012  Patient:  Vanessa Lamb, Vanessa Lamb   Account Number:  [id number]  Date Initiated:  02/28/2012  Documentation initiated by:  Rosemary Holms  Subjective/Objective Assessment:   Pt admitted from home with acute colitis. Very independent with ADL.     Action/Plan:   No HH needs identified   Anticipated DC Date:  03/01/2012   Anticipated DC Plan:  HOME/SELF CARE      DC Planning Services  CM consult      Choice offered to / List presented to:             Status of service:  Completed, signed off Medicare Important Message given?   (If response is "NO", the following Medicare IM given date fields will be blank) Date Medicare IM given:   Date Additional Medicare IM given:    Discharge Disposition:  HOME/SELF CARE  Per UR Regulation:    If discussed at Long Length of Stay Meetings, dates discussed:    Comments:  02/28/12 1100 Shawnique Mariotti Leanord Hawking RN BSN CM

## 2012-02-29 LAB — BASIC METABOLIC PANEL
BUN: <3 mg/dL — ABNORMAL LOW (ref 6–23)
Chloride: 109 mEq/L (ref 96–112)
Creatinine, Ser: 0.62 mg/dL (ref 0.50–1.10)
Glucose, Bld: 104 mg/dL — ABNORMAL HIGH (ref 70–99)
Potassium: 3.8 mEq/L (ref 3.5–5.1)

## 2012-02-29 MED ORDER — CIPROFLOXACIN HCL 250 MG PO TABS
500.0000 mg | ORAL_TABLET | Freq: Two times a day (BID) | ORAL | Status: DC
  Administered 2012-02-29 – 2012-03-01 (×3): 500 mg via ORAL
  Filled 2012-02-29 (×3): qty 2

## 2012-02-29 MED ORDER — METRONIDAZOLE 500 MG PO TABS
500.0000 mg | ORAL_TABLET | Freq: Three times a day (TID) | ORAL | Status: DC
  Administered 2012-02-29 (×3): 500 mg via ORAL
  Filled 2012-02-29 (×3): qty 1

## 2012-02-29 NOTE — Progress Notes (Signed)
Subjective: This lady was admitted with presumed infectious colitis, descending colon. She feels  better. She is still having diarrhea, no nausea or vomiting. Abdominal pain is not particularly impressive. C. difficile toxin is negative. She is on intravenous antibiotics.           Physical Exam: Blood pressure 96/68, pulse 80, temperature 98.3 F (36.8 C), temperature source Oral, resp. rate 18, height 5\' 4"  (1.626 m), weight 85.7 kg (188 lb 15 oz), SpO2 98.00%. She looks systemically well. She is not toxic or septic. Her abdomen is soft and mildly tender in the left lower quadrant. Bowel sounds are heard. Heart sounds are present and normal. Lung fields are clear. She is alert and orientated.   Investigations:  Recent Results (from the past 240 hour(s))  CLOSTRIDIUM DIFFICILE BY PCR     Status: Normal   Collection Time   02/27/12  8:14 PM      Component Value Range Status Comment   C difficile by pcr NEGATIVE  NEGATIVE Final      Basic Metabolic Panel:  Basename 02/29/12 0551 02/28/12 0512  NA 139 138  K 3.8 3.7  CL 109 107  CO2 24 23  GLUCOSE 104* 103*  BUN <3* 5*  CREATININE 0.62 0.82  CALCIUM 8.8 8.7  MG -- 1.7  PHOS -- --   Liver Function Tests:  [facility] 02/27/12 1755  AST 14  ALT 12  ALKPHOS 84  BILITOT 0.4  PROT 8.1  ALBUMIN 4.2     CBC:  Basename 02/28/12 0512 02/27/12 1755  WBC 7.8 10.1  NEUTROABS -- 7.7  HGB 12.9 16.4*  HCT 37.3 46.5*  MCV 92.6 92.1  PLT 150 184    Ct Abdomen Pelvis W Contrast  02/27/2012  *RADIOLOGY REPORT*  Clinical Data: Lower abdominal pain and diarrhea for 3 days.  CT ABDOMEN AND PELVIS WITH CONTRAST  Technique:  Multidetector CT imaging of the abdomen and pelvis was performed following the standard protocol during bolus administration of intravenous contrast.  Contrast: OMNIPAQUE IOHEXOL 300 MG/ML  SOLN  Comparison: Renal ultrasound 11/03/2010.  Findings: Mild dependent atelectasis is seen in the lung  bases.  No pleural or pericardial effusion.  The liver, gallbladder, adrenal glands, spleen, pancreas and left kidney are unremarkable.  Small low attenuating lesion in the lower pole right kidney is compatible with a cyst and as seen on the comparison study.  The patient is status post hysterectomy.  The stomach and small bowel are unremarkable.  The colon is largely decompressed with the walls of the descending and sigmoid colon thickened with mild pericolonic inflammatory change.  No pneumatosis, portal venous gas, free air or fluid collection is identified.  IMPRESSION: Findings consistent with colitis of the descending and sigmoid, either infectious or inflammatory.   Original Report Authenticated By: Bernadene Bell. Maricela Curet, M.D.       Medications:  Scheduled:    . ciprofloxacin  500 mg Oral BID  . enoxaparin (LOVENOX) injection  40 mg Subcutaneous Q24H  . metroNIDAZOLE  500 mg Oral TID  . pantoprazole  40 mg Oral Q1200  . sodium chloride      . DISCONTD: ciprofloxacin  400 mg Intravenous Q12H  . DISCONTD: metronidazole  500 mg Intravenous Q8H    Impression: 1. Presumed infectious descending colitis. 2. Obesity. 3. History of GERD.     Plan: 1. Switch intravenous antibiotics to oral. 2. Advance diet. 3. She tolerates diet today as well as the oral  antibiotics, I think she is safe to be discharged home. Her white blood cell count is not elevated and she does not have a fever.     LOS: 2 days   Wilson Singer Pager [phone]  02/29/2012, 7:52 AM

## 2012-02-29 NOTE — Progress Notes (Signed)
Pt c/o gas with no relief. Dr. Karilyn Cota paged at [phone] in order to receive order for anti-gas medication. No return page at this time. Will continue to monitor.

## 2012-03-01 DIAGNOSIS — F17200 Nicotine dependence, unspecified, uncomplicated: Secondary | ICD-10-CM

## 2012-03-01 MED ORDER — CIPROFLOXACIN HCL 500 MG PO TABS: 500.0000 mg | ORAL_TABLET | Freq: Two times a day (BID) | ORAL | Status: DC

## 2012-03-01 MED ORDER — NICOTINE 21 MG/24HR TD PT24: 1.0000 | MEDICATED_PATCH | Freq: Every day | TRANSDERMAL | Status: DC | PRN

## 2012-03-01 MED ORDER — METRONIDAZOLE 500 MG PO TABS: 500.0000 mg | ORAL_TABLET | Freq: Three times a day (TID) | ORAL | Status: DC

## 2012-03-01 NOTE — Plan of Care (Signed)
Problem: Discharge Progression Outcomes Goal: Other Discharge Outcomes/Goals Outcome: Completed/Met Date Met:  03/01/12 Discharge instructions read to pt .  Pt verbalized understanding of all instructions Discharged to home with friend

## 2012-03-01 NOTE — Discharge Summary (Signed)
Physician Discharge Summary  Vanessa Lamb NWG:[id number] DOB: Jun 11, 1976 DOA: 02/27/2012  PCP: Cassell Smiles., MD  Admit date: 02/27/2012 Discharge date: 03/01/2012  Recommendations for Outpatient Follow-up:  1. Follow up primary care physician in approximately one week.   Discharge Diagnoses:  1. Acute descending presumed infectious colitis, improving. C. difficile toxin negative. No evidence of inflammatory bowel disease, clinically. 2. Obesity. 3. GERD. 4. Tobacco abuse.   Discharge Condition: Stable and improved.  Diet recommendation: Advise as she tolerates.  Filed Weights   02/27/12 1732 02/29/12 0143 03/01/12 0647  Weight: 81.647 kg (180 lb) 85.7 kg (188 lb 15 oz) 85.2 kg (187 lb 13.3 oz)    History of present illness:  This 36 year old lady was admitted with symptoms of cramping abdominal pain and diarrhea for 2 days prior to hospitalization. There is no history of rectal bleeding, no significant nausea or vomiting. CT scan of the abdomen in the emergency room showed inflammation of the descending and sigmoid colon.  Hospital Course:  She was admitted and started on intravenous fluids and intravenous antibiotics. She had a very good response. C. difficile toxin was negative. She did not have a fever. Her white blood cell count was not acutely increased. Yesterday she was switched to oral antibiotics and she has tolerated these well. She is also tolerating a diet without any vomiting. She still has diarrhea but this is much improved. She does have some cramping abdominal pain but this is tolerable.  Procedures:  None.  Consultations:  None.  Discharge Exam: Filed Vitals:   03/01/12 0549  BP: 105/72  Pulse: 83  Temp: 97.9 F (36.6 C)  Resp: 18   Filed Vitals:   02/29/12 1805 02/29/12 2049 03/01/12 0549 03/01/12 0647  BP: 100/71 145/81 105/72   Pulse: 76 79 83   Temp: 98.4 F (36.9 C) 98 F (36.7 C) 97.9 F (36.6 C)   TempSrc: Oral Oral Oral   Resp: 18 18  18    Height:      Weight:    85.2 kg (187 lb 13.3 oz)  SpO2: 100% 100% 99%     General: She looks systemically well. She is not toxic or septic. Cardiovascular: Heart sounds are present and normal without murmurs. Respiratory: Lung fields are clear. Abdomen soft and mildly tender in the left lower quadrant but less so than yesterday.  Discharge Instructions  Discharge Orders    Future Orders Please Complete By Expires   Diet - low sodium heart healthy      Increase activity slowly        Medication List  As of 03/01/2012  8:26 AM   TAKE these medications         ciprofloxacin 500 MG tablet   Commonly known as: CIPRO   Take 1 tablet (500 mg total) by mouth 2 (two) times daily.      metroNIDAZOLE 500 MG tablet   Commonly known as: FLAGYL   Take 1 tablet (500 mg total) by mouth 3 (three) times daily.      nicotine 21 mg/24hr patch   Commonly known as: NICODERM CQ - dosed in mg/24 hours   Place 1 patch onto the skin daily as needed (nicotine withdrawal).              The results of significant diagnostics from this hospitalization (including imaging, microbiology, ancillary and laboratory) are listed below for reference.    Significant Diagnostic Studies: Ct Abdomen Pelvis W Contrast  02/27/2012  *RADIOLOGY REPORT*  Clinical Data: Lower abdominal pain and diarrhea for 3 days.  CT ABDOMEN AND PELVIS WITH CONTRAST  Technique:  Multidetector CT imaging of the abdomen and pelvis was performed following the standard protocol during bolus administration of intravenous contrast.  Contrast: OMNIPAQUE IOHEXOL 300 MG/ML  SOLN  Comparison: Renal ultrasound 11/03/2010.  Findings: Mild dependent atelectasis is seen in the lung bases.  No pleural or pericardial effusion.  The liver, gallbladder, adrenal glands, spleen, pancreas and left kidney are unremarkable.  Small low attenuating lesion in the lower pole right kidney is compatible with a cyst and as seen on the comparison study.   The patient is status post hysterectomy.  The stomach and small bowel are unremarkable.  The colon is largely decompressed with the walls of the descending and sigmoid colon thickened with mild pericolonic inflammatory change.  No pneumatosis, portal venous gas, free air or fluid collection is identified.  IMPRESSION: Findings consistent with colitis of the descending and sigmoid, either infectious or inflammatory.   Original Report Authenticated By: Bernadene Bell. Maricela Curet, M.D.     Microbiology: Recent Results (from the past 240 hour(s))  CLOSTRIDIUM DIFFICILE BY PCR     Status: Normal   Collection Time   02/27/12  8:14 PM      Component Value Range Status Comment   C difficile by pcr NEGATIVE  NEGATIVE Final   STOOL CULTURE     Status: Normal (Preliminary result)   Collection Time   02/28/12  2:00 AM      Component Value Range Status Comment   Specimen Description STOOL   Final    Special Requests NONE   Final    Culture Culture reincubated for better growth   Final    Report Status PENDING   Incomplete      Labs: Basic Metabolic Panel:  Lab 02/29/12 0454 02/28/12 0512 02/27/12 1755  NA 139 138 134*  K 3.8 3.7 3.5  CL 109 107 98  CO2 24 23 26   GLUCOSE 104* 103* 97  BUN <3* 5* 4*  CREATININE 0.62 0.82 0.71  CALCIUM 8.8 8.7 10.3  MG -- 1.7 --  PHOS -- -- --   Liver Function Tests:  Lab 02/27/12 1755  AST 14  ALT 12  ALKPHOS 84  BILITOT 0.4  PROT 8.1  ALBUMIN 4.2    Lab 02/27/12 1755  LIPASE 18  AMYLASE --    CBC:  Lab 02/28/12 0512 02/27/12 1755  WBC 7.8 10.1  NEUTROABS -- 7.7  HGB 12.9 16.4*  HCT 37.3 46.5*  MCV 92.6 92.1  PLT 150 184      Time coordinating discharge: Greater than 30 minutes  Signed:  Samira Acero C  [facility] 03/01/2012, 8:26 AM

## 2012-03-01 NOTE — Progress Notes (Signed)
Patient is afraid that she will be back with the same thing. She is wondering if it might have something to do with her B12 deficiency. She also wonders if she has IBS. She states she feels the same way she did when she was admitted, she stated she feels pressure and as if everything is smoshed together, nothing has changed. She is also supposed to be taking zocor, B12 shots monthly, & estradol. She states she hasn't taken them because she cant remember.

## 2012-03-03 LAB — STOOL CULTURE

## 2012-03-07 NOTE — Progress Notes (Signed)
UR Chart Review Completed  

## 2012-03-09 ENCOUNTER — Ambulatory Visit (INDEPENDENT_AMBULATORY_CARE_PROVIDER_SITE_OTHER): Payer: Managed Care, Other (non HMO) | Admitting: Urgent Care

## 2012-03-09 ENCOUNTER — Encounter (HOSPITAL_COMMUNITY): Payer: Self-pay | Admitting: Pharmacy Technician

## 2012-03-09 ENCOUNTER — Other Ambulatory Visit: Payer: Self-pay

## 2012-03-09 ENCOUNTER — Encounter: Payer: Self-pay | Admitting: Urgent Care

## 2012-03-09 VITALS — BP 121/87 | HR 86 | Temp 97.8°F | Ht 64.0 in | Wt 184.4 lb

## 2012-03-09 DIAGNOSIS — R197 Diarrhea, unspecified: Secondary | ICD-10-CM

## 2012-03-09 DIAGNOSIS — K219 Gastro-esophageal reflux disease without esophagitis: Secondary | ICD-10-CM

## 2012-03-09 DIAGNOSIS — K5289 Other specified noninfective gastroenteritis and colitis: Secondary | ICD-10-CM

## 2012-03-09 DIAGNOSIS — R110 Nausea: Secondary | ICD-10-CM | POA: Insufficient documentation

## 2012-03-09 DIAGNOSIS — K529 Noninfective gastroenteritis and colitis, unspecified: Secondary | ICD-10-CM | POA: Insufficient documentation

## 2012-03-09 MED ORDER — PEG-KCL-NACL-NASULF-NA ASC-C 100 G PO SOLR: 1.0000 | ORAL | Status: DC

## 2012-03-09 MED ORDER — OMEPRAZOLE 20 MG PO CPDR: 20.0000 mg | DELAYED_RELEASE_CAPSULE | Freq: Every day | ORAL | Status: DC

## 2012-03-09 NOTE — Patient Instructions (Addendum)
Omeprazole 20 mg daily for acid reflux 30 minutes before breakfast 1-800-quit-now for help quitting smoking You need a colonoscopy & EGD with Dr. Darrick Penna as soon as possible Call if severe pain, bleeding or dehydration or go to the emergency department.

## 2012-03-09 NOTE — Assessment & Plan Note (Signed)
Vanessa Lamb is a pleasant 36 y.o. female with lifelong history of IBS-type symptoms including urgency.  More recently she was admitted with acute colitis. CT is in stool culture negative. She has completed a course of Cipro and Flagyl, however she has persistent pain. She has had bloody diarrhea as well. We need to rule out inflammatory bowel disease. She has persistent nausea and will undergo EGD at the same time of colonoscopy.  EGD and colonoscopy with Dr. Darrick Penna.  I have discussed risks & benefits which include, but are not limited to, bleeding, infection, perforation & drug reaction.  The patient agrees with this plan & written consent will be obtained.

## 2012-03-09 NOTE — Progress Notes (Signed)
Faxed to PCP

## 2012-03-09 NOTE — Progress Notes (Signed)
Referring Provider: Cassell Smiles., MD Primary Care Physician:  Cassell Smiles., MD Primary Gastroenterologist:  Dr. Jonette Eva  Chief Complaint  Patient presents with  . Gas  . Abdominal Pain  . Ulcerative Colitis    HPI:  Vanessa Lamb is a 36 y.o. female here as a referral from Dr. Sherwood Gambler for abdominal pain and colitis. On July 4, she began to have incontinence with loose stools.  She gives history of self diagnosed irritable bowel syndrome. She tells me her IBS-type symptoms have worsened over the last 2 months. She has had very painful gas.  Cannot tolerate dairy products.  TNTC diarrhea & felt like bottom was gonna fall out.  She was admitted to the hospital in August and completed Cipro and Flagyl after CT scan showed colitis of the descending and sigmoid colon.  However, she continues to have problems.  c/o nausea & being "sick" after every time she eats.  Bowel movements have improved to 3 per day recently but still loose and mucousy.  Denies rectal bleeding or melena currently, but did notice scant bright red blood in her stool while in the hospital.  Denies vomiting.  She notes "terrible" indigsstion.  She has been eating Rolaids on a regular basis. She also takes an occasional IBU 2 per day, and denies any other NSAIDs.     Results reviewed from recent hospitalization 8/25: Sodium 134, otherwise normal CMP. Hemoglobin 16.4, otherwise normal CBC. Lipase normal. Magnesium normal. C. difficile and stool culture are normal 8/25 CT of abdomen and pelvis with IV contrast- colitis of the descending and sigmoid, either infectious or inflammatory.  Past Medical History  Diagnosis Date  . Hyperlipemia   . GERD (gastroesophageal reflux disease)   . IBS (irritable bowel syndrome)   . B12 deficiency     Past Surgical History  Procedure Date  . Abdominal hysterectomy 2005    complete  . Tubal ligation   . Wisdom tooth extraction     Current Outpatient Prescriptions  Medication  Sig Dispense Refill  . omeprazole (PRILOSEC) 20 MG capsule Take 1 capsule (20 mg total) by mouth daily.  31 capsule  1  . peg 3350 powder (MOVIPREP) 100 G SOLR Take 1 kit (100 g total) by mouth as directed.  1 kit  0    Allergies as of 03/09/2012  . (No Known Allergies)    Family History  Problem Relation Age of Onset  . Hypertension Mother   . Hypertension Father   . Ulcers Father     PUD  . Colon cancer Maternal Grandfather 27  . Colonic polyp Son 9  . Diabetes Father   . Thyroid cancer Mother     History   Social History  . Marital Status: Single    Spouse Name: N/A    Number of Children: 2  . Years of Education: N/A   Occupational History  . machine op [employer]   Social History Main Topics  . Smoking status: Current Everyday Smoker -- 1.0 packs/day for 10 years    Types: Cigarettes  . Smokeless tobacco: Not on file  . Alcohol Use: No  . Drug Use: No  . Sexually Active: Not on file   Other Topics Concern  . Not on file   Social History Narrative  . No narrative on file    Review of Systems: Gen: See history of present illness  CV: Denies chest pain, angina, palpitations, syncope, orthopnea, PND, peripheral edema, and claudication. Resp: Denies  dyspnea at rest, dyspnea with exercise, cough, sputum, wheezing, coughing up blood, and pleurisy. GI: Denies vomiting blood, jaundice, and fecal incontinence.   Denies dysphagia or odynophagia. GU : Denies urinary burning, blood in urine, urinary frequency, urinary hesitancy, nocturnal urination, and urinary incontinence. MS: Denies joint pain, limitation of movement, and swelling, stiffness, low back pain, extremity pain. Denies muscle weakness, cramps, atrophy.  Derm: Denies rash, itching, dry skin, hives, moles, warts, or unhealing ulcers.  Psych: Denies depression, anxiety, memory loss, suicidal ideation, hallucinations, paranoia, and confusion. Heme: Denies bruising, bleeding, and enlarged lymph  nodes. Neuro:  Denies any headaches, dizziness, paresthesias. Endo:  Denies any problems with DM, thyroid, adrenal function.  Physical Exam: BP 121/87  Pulse 86  Temp 97.8 F (36.6 C) (Temporal)  Ht 5\' 4"  (1.626 m)  Wt 184 lb 6.4 oz (83.643 kg)  BMI 31.65 kg/m2 No LMP recorded. Patient has had a hysterectomy. General:   Alert,  Well-developed, well-nourished, pleasant and cooperative in NAD Head:  Normocephalic and atraumatic. Eyes:  Sclera clear, no icterus.   Conjunctiva pink. Ears:  Normal auditory acuity. Nose:  No deformity, discharge, or lesions. Mouth:  No deformity or lesions,oropharynx pink & moist. Neck:  Supple; no masses or thyromegaly. Lungs:  Clear throughout to auscultation.   No wheezes, crackles, or rhonchi. No acute distress. Heart:  Regular rate and rhythm; no murmurs, clicks, rubs,  or gallops. Abdomen:  Umbilical ornament. Normal bowel sounds.  No bruits.  Soft and non-distended.  + Mild left lower quadrant and mid abdominal pain on palpation. without masses, hepatosplenomegaly or hernias noted.  No guarding or rebound tenderness.   Rectal:  Deferred. Msk:  Symmetrical without gross deformities. Normal posture. Pulses:  Normal pulses noted. Extremities:  No clubbing or edema. Neurologic:  Alert and oriented x4;  grossly normal neurologically. Skin:  Multiple tattoos. Intact without significant lesions or rashes. Lymph Nodes:  No significant cervical adenopathy. Psych:  Alert and cooperative. Normal mood and affect.

## 2012-03-09 NOTE — Assessment & Plan Note (Signed)
IBS versus IBD.  Call if severe pain, bleeding or dehydration or go to the emergency department. Further recommendations to follow procedures.

## 2012-03-09 NOTE — Assessment & Plan Note (Signed)
Begin omeprazole 20 mg daily 30 minutes before breakfast.

## 2012-03-09 NOTE — Assessment & Plan Note (Signed)
EGD. Question inflammatory bowel disease versus GERD or peptic ulcer disease, less likely.

## 2012-03-15 ENCOUNTER — Encounter (HOSPITAL_COMMUNITY): Payer: Self-pay | Admitting: *Deleted

## 2012-03-15 ENCOUNTER — Ambulatory Visit (HOSPITAL_COMMUNITY)
Admission: RE | Admit: 2012-03-15 | Discharge: 2012-03-15 | Disposition: A | Discharge status: Home / Self Care | Payer: Managed Care, Other (non HMO) | Source: Ambulatory Visit | Attending: Gastroenterology | Admitting: Gastroenterology

## 2012-03-15 ENCOUNTER — Encounter (HOSPITAL_COMMUNITY)
Admission: RE | Disposition: A | Discharge status: Home / Self Care | Payer: Self-pay | Source: Ambulatory Visit | Attending: Gastroenterology

## 2012-03-15 DIAGNOSIS — K209 Esophagitis, unspecified without bleeding: Secondary | ICD-10-CM

## 2012-03-15 DIAGNOSIS — R110 Nausea: Secondary | ICD-10-CM

## 2012-03-15 DIAGNOSIS — K921 Melena: Secondary | ICD-10-CM | POA: Insufficient documentation

## 2012-03-15 DIAGNOSIS — K2990 Gastroduodenitis, unspecified, without bleeding: Secondary | ICD-10-CM

## 2012-03-15 DIAGNOSIS — K529 Noninfective gastroenteritis and colitis, unspecified: Secondary | ICD-10-CM

## 2012-03-15 DIAGNOSIS — R197 Diarrhea, unspecified: Secondary | ICD-10-CM

## 2012-03-15 DIAGNOSIS — K2970 Gastritis, unspecified, without bleeding: Secondary | ICD-10-CM

## 2012-03-15 DIAGNOSIS — K2940 Chronic atrophic gastritis without bleeding: Secondary | ICD-10-CM | POA: Insufficient documentation

## 2012-03-15 DIAGNOSIS — R1013 Epigastric pain: Secondary | ICD-10-CM

## 2012-03-15 DIAGNOSIS — K648 Other hemorrhoids: Secondary | ICD-10-CM | POA: Insufficient documentation

## 2012-03-15 DIAGNOSIS — K3189 Other diseases of stomach and duodenum: Secondary | ICD-10-CM

## 2012-03-15 DIAGNOSIS — K219 Gastro-esophageal reflux disease without esophagitis: Secondary | ICD-10-CM

## 2012-03-15 DIAGNOSIS — E785 Hyperlipidemia, unspecified: Secondary | ICD-10-CM | POA: Insufficient documentation

## 2012-03-15 DIAGNOSIS — R109 Unspecified abdominal pain: Secondary | ICD-10-CM

## 2012-03-15 DIAGNOSIS — K449 Diaphragmatic hernia without obstruction or gangrene: Secondary | ICD-10-CM | POA: Insufficient documentation

## 2012-03-15 DIAGNOSIS — R12 Heartburn: Secondary | ICD-10-CM | POA: Insufficient documentation

## 2012-03-15 HISTORY — PX: ESOPHAGOGASTRODUODENOSCOPY: SHX5428

## 2012-03-15 HISTORY — PX: COLONOSCOPY: SHX5424

## 2012-03-15 SURGERY — COLONOSCOPY
Anesthesia: Moderate Sedation

## 2012-03-15 MED ORDER — SODIUM CHLORIDE 0.45 % IV SOLN
INTRAVENOUS | Status: DC
  Administered 2012-03-15: 10:00:00 via INTRAVENOUS

## 2012-03-15 MED ORDER — OMEPRAZOLE 20 MG PO CPDR: DELAYED_RELEASE_CAPSULE | ORAL | Status: DC

## 2012-03-15 MED ORDER — PROMETHAZINE HCL 25 MG/ML IJ SOLN
INTRAMUSCULAR | Status: DC | PRN
  Administered 2012-03-15: 12.5 mg via INTRAVENOUS

## 2012-03-15 MED ORDER — SODIUM CHLORIDE 0.9 % IJ SOLN
INTRAMUSCULAR | Status: AC
  Filled 2012-03-15: qty 10

## 2012-03-15 MED ORDER — MEPERIDINE HCL 100 MG/ML IJ SOLN
INTRAMUSCULAR | Status: DC | PRN
  Administered 2012-03-15 (×2): 25 mg via INTRAVENOUS
  Administered 2012-03-15: 50 mg via INTRAVENOUS
  Administered 2012-03-15: 25 mg via INTRAVENOUS

## 2012-03-15 MED ORDER — MIDAZOLAM HCL 5 MG/5ML IJ SOLN
INTRAMUSCULAR | Status: DC | PRN
  Administered 2012-03-15: 1 mg via INTRAVENOUS
  Administered 2012-03-15 (×2): 2 mg via INTRAVENOUS
  Administered 2012-03-15 (×2): 1 mg via INTRAVENOUS
  Administered 2012-03-15: 2 mg via INTRAVENOUS

## 2012-03-15 MED ORDER — MIDAZOLAM HCL 5 MG/5ML IJ SOLN
INTRAMUSCULAR | Status: AC
  Filled 2012-03-15: qty 10

## 2012-03-15 MED ORDER — ALIGN 4 MG PO CAPS: 4.0000 mg | ORAL_CAPSULE | Freq: Every day | ORAL | Status: DC

## 2012-03-15 MED ORDER — PROMETHAZINE HCL 25 MG/ML IJ SOLN
INTRAMUSCULAR | Status: AC
  Filled 2012-03-15: qty 1

## 2012-03-15 MED ORDER — MEPERIDINE HCL 100 MG/ML IJ SOLN
INTRAMUSCULAR | Status: AC
  Filled 2012-03-15: qty 2

## 2012-03-15 MED ORDER — STERILE WATER FOR IRRIGATION IR SOLN
Status: DC | PRN
  Administered 2012-03-15: 10:00:00

## 2012-03-15 NOTE — Op Note (Signed)
[facility] [address] [place], [address]   ENDOSCOPY PROCEDURE REPORT  PATIENT: Vanessa Lamb, Vanessa Lamb  MR#: [id number] BIRTHDATE: [birth year] , 36  yrs. old GENDER: Female  ENDOSCOPIST: Jonette Eva, MD REFERRED XB:[id number] Sherwood Gambler, M.D.  PROCEDURE DATE: 03/15/2012 PROCEDURE:   EGD w/ biopsy  INDICATIONS:new onset dyspepsia, heartburn, diarrhea. MEDICATIONS: TCS + NONE TOPICAL ANESTHETIC:   Cetacaine Spray  DESCRIPTION OF PROCEDURE:     Physical exam was performed.  Informed consent was obtained from the patient after explaining the benefits, risks, and alternatives to the procedure.  The patient was connected to the monitor and placed in the left lateral position.  Continuous oxygen was provided by nasal cannula and IV medicine administered through an indwelling cannula.  After administration of sedation, the patients esophagus was intubated and the EC-3890Li ([id number]) and Pentax Gastroscope [id number] endoscope was advanced under direct visualization to the second portion of the duodenum.  The scope was removed slowly by carefully examining the color, texture, anatomy, and integrity of the mucosa on the way out.  The patient was recovered in endoscopy and discharged home in satisfactory condition.      ESOPHAGUS: Esophagitis IN DISTAL ESOPHAGUS.  NO BARRETT'S. A 3 cm hiatal hernia was noted.  STOMACH: Non-erosive gastritis (inflammation) was found in the gastric antrum.  Multiple biopsies were performed using cold forceps.  DUODENUM: The duodenal mucosa showed no abnormalities.  Cold forcep biopsies were taken in the second portion.  COMPLICATIONS:   None  ENDOSCOPIC IMPRESSION: 1.   Esophagitis, MILD 2.  SMALL hiatal hernia 3.   Non-erosive gastritis  RECOMMENDATIONS: 1.  await biopsy results 2.  high fiber/dairy free/low fat diet OMEPRAZOLE BID FOR 3 MOS THEN ONCE DAILY lose 10-20 lbs stop smoking opv in 2 mos w/ SLF   REPEAT  EXAM:   _______________________________ Rosalie DoctorJonette Eva, MD 03/15/2012 11:29 AM       PATIENT NAME:  Vanessa Lamb, Vanessa Lamb MR#: [id number]

## 2012-03-15 NOTE — Op Note (Addendum)
[facility] [address] [place], [address]   COLONOSCOPY PROCEDURE REPORT  PATIENT: Vanessa Lamb, Vanessa Lamb  MR#: [id number] BIRTHDATE: 11-15-1975 , 36  yrs. old GENDER: Female ENDOSCOPIST: Jonette Eva, MD REFERRED AO:ZHYQM Sherwood Gambler, M.D. PROCEDURE DATE:  03/15/2012 PROCEDURE:   ILEOColonoscopy with biopsy INDICATIONS:bloody diarrhea, abdominal pain, CT AUG 2013: dc/[place] colitis. MEDICATIONS: Demerol 125 mg IV, Versed 9 mg IV, and Promethazine (Phenergan) 12.5 mg IV  DESCRIPTION OF PROCEDURE:    Physical exam was performed.  Informed consent was obtained from the patient after explaining the benefits, risks, and alternatives to procedure.  The patient was connected to monitor and placed in left lateral position. Continuous oxygen was provided by nasal cannula and IV medicine administered through an indwelling cannula.  After administration of sedation and rectal exam, the patients rectum was intubated and the EC-3890Li ([id number])  colonoscope was advanced under direct visualization to the ileum.  The scope was removed slowly by carefully examining the color, texture, anatomy, and integrity mucosa on the way out.  The patient was recovered in endoscopy and discharged home in satisfactory condition.       COLON FINDINGS: The mucosa appeared normal in the terminal ileum.  10-15 cm visualized., The colonic mucosa appeared normal. Multiple random biopsies of the area were performed.  Cytology was performed.  , and Small internal hemorrhoids were found.  PREP QUALITY: excellent. CECAL W/D TIME: 9 minutes  COMPLICATIONS: None  ENDOSCOPIC IMPRESSION: 1.   Normal mucosa in the terminal ileum 2.   The colonic mucosa appeared normal; 3.   Small internal hemorrhoids 4.  NO OBVIOUS SOURCE FOR DIARRHEA AND ABD PAIN IDENTIFIED   RECOMMENDATIONS: 1.  await biopsy results 2.  HigH/LACTOSE FREE DIET 3. TCS AT AGE 27 4. OPV IN 2  MOS       _______________________________ eSignedJonette Eva, MD 03/15/2012 11:41 AM Revised: 03/15/2012 11:41 AM    PATIENT NAME:  Vanessa Lamb, Vanessa Lamb MR#: [id number]

## 2012-03-15 NOTE — H&P (Signed)
  Primary Care Physician:  Cassell Smiles., MD Primary Gastroenterologist:  Dr. Darrick Penna  Pre-Procedure History & Physical: HPI:  Vanessa Lamb is a 36 y.o. female here for  DIARRHEA/ABDOMINAL PAIN. CT AUG 25 SHOWED DEFINITE EVIDENCE OF [place]/DC COLITIS. I PERSONALLY REVIEWED CT WITH RADIOLOGY.  Past Medical History  Diagnosis Date  . Hyperlipemia   . GERD (gastroesophageal reflux disease)   . IBS (irritable bowel syndrome)   . B12 deficiency     Past Surgical History  Procedure Date  . Abdominal hysterectomy 2005    complete  . Tubal ligation   . Wisdom tooth extraction     Prior to Admission medications   Medication Sig Start Date End Date Taking? Authorizing Provider  ibuprofen (ADVIL,MOTRIN) 200 MG tablet Take 400 mg by mouth every 6 (six) hours as needed. For pain   Yes Historical Provider, MD  omeprazole (PRILOSEC) 20 MG capsule Take 1 capsule (20 mg total) by mouth daily. 03/09/12 03/09/13  Joselyn Arrow, NP    Allergies as of 03/09/2012  . (No Known Allergies)    Family History  Problem Relation Age of Onset  . Hypertension Mother   . Hypertension Father   . Ulcers Father     PUD  . Colon cancer Maternal Grandfather 41  . Colonic polyp Son 9  . Diabetes Father   . Thyroid cancer Mother     History   Social History  . Marital Status: Single    Spouse Name: N/A    Number of Children: 2  . Years of Education: N/A   Occupational History  . machine op [employer]   Social History Main Topics  . Smoking status: Current Every Day Smoker -- 1.0 packs/day for 10 years    Types: Cigarettes  . Smokeless tobacco: Not on file  . Alcohol Use: No  . Drug Use: No  . Sexually Active: Not on file   Other Topics Concern  . Not on file   Social History Narrative  . No narrative on file    Review of Systems: See HPI, otherwise negative ROS   Physical Exam: BP 113/76  Pulse 85  Temp 98.1 F (36.7 C) (Oral)  Resp 18  Ht 5\' 4"  (1.626 m)  Wt 184 lb  (83.462 kg)  BMI 31.58 kg/m2  SpO2 98% General:   Alert,  pleasant and cooperative in NAD Head:  Normocephalic and atraumatic. Neck:  Supple; Lungs:  Clear throughout to auscultation.    Heart:  Regular rate and rhythm. Abdomen:  Soft, nontender and nondistended. Normal bowel sounds, without guarding, and without rebound.   Neurologic:  Alert and  oriented x4;  grossly normal neurologically.  Impression/Plan:     DIARRHEA/ABDOMINAL PAIN  PLAN: TCS/EGD TODAY WITH BIOPSY

## 2012-03-20 ENCOUNTER — Telehealth: Payer: Self-pay | Admitting: Gastroenterology

## 2012-03-20 ENCOUNTER — Encounter (HOSPITAL_COMMUNITY): Payer: Self-pay | Admitting: Gastroenterology

## 2012-03-20 NOTE — Telephone Encounter (Signed)
Please call patient at [phone] when her tcs results are available.

## 2012-03-21 NOTE — Telephone Encounter (Signed)
Routing to Dr. Fields.  

## 2012-03-22 ENCOUNTER — Encounter: Payer: Self-pay | Admitting: Gastroenterology

## 2012-03-22 ENCOUNTER — Telehealth: Payer: Self-pay | Admitting: *Deleted

## 2012-03-22 NOTE — Telephone Encounter (Signed)
ERROR

## 2012-03-22 NOTE — Telephone Encounter (Signed)
Pt returned call and was informed.  

## 2012-03-22 NOTE — Progress Notes (Addendum)
EGD SEP 2013 ATROPHIC GASTRITIS. PT HAS HX: VIT B12 DEFICIENCY. MAY NEED REFERRAL TO [facility] TO EVALUATE FOR PERNICIOUS ANEMIA. WILL DISCUSS AT NEXT OPV.  TCS: NL COLON Bx EGD: NL DUO Bx

## 2012-03-22 NOTE — Telephone Encounter (Signed)
LMOM to call.

## 2012-03-22 NOTE — Telephone Encounter (Addendum)
Please call pt. Her colon and small bowel biopsies are normal. HER stomach Bx shows gastritis. HER SYMPTOMS ARE DUE TO IBS, LACTOSE INTOLERANCE, REFLUX, AND GASTRITIS.   TAKE OMEPRAZOLE 30 MINUTES PRIOR TO MEALS TWICE DAILY FOR 3 MOS THE ONCE DAILY TO TREAT REFLUX/GASTRITIS.  TAKE ALIGN DAILY. CHEAPER ALTERNATIVES TO ALIGN INCLUDE [employer] BRAND OR PHILLIP'S COLON HEALTH.  STOP SMOKING.  LOSE 10 TO 20 LBS.  AVOID ITEMS THAT TRIGGER GASTRITIS.   FOLLOW A HIGH FIBER/LOW FAT/DAIRY FREE DIET. AVOID ITEMS THAT CAUSE BLOATING.   OPV IN 3 MOS E 30 W/ SLF  Next colonoscopy AT AGE 21.

## 2012-03-22 NOTE — Telephone Encounter (Signed)
Ms Souders called today regarding her results of her recent TCS. She would like a call back. Thanks.

## 2012-03-22 NOTE — Progress Notes (Signed)
REVIEWED.  

## 2012-03-22 NOTE — Telephone Encounter (Signed)
REVIEWED.  

## 2012-03-22 NOTE — Telephone Encounter (Signed)
Called pt and lmom   [phone], that Dr. Darrick Penna is in office todday and will be reviewing some results and then I will gvie her a call.

## 2012-03-23 NOTE — Telephone Encounter (Signed)
Reminder in epic to follow up in 3 months with SF in E30 and tcs at age 36

## 2012-06-05 ENCOUNTER — Encounter: Payer: Self-pay | Admitting: *Deleted

## 2012-10-13 ENCOUNTER — Emergency Department (HOSPITAL_COMMUNITY)
Admission: EM | Admit: 2012-10-13 | Discharge: 2012-10-13 | Disposition: A | Discharge status: Home / Self Care | Payer: Managed Care, Other (non HMO) | Attending: Emergency Medicine | Admitting: Emergency Medicine

## 2012-10-13 ENCOUNTER — Encounter (HOSPITAL_COMMUNITY): Payer: Self-pay | Admitting: *Deleted

## 2012-10-13 DIAGNOSIS — F17200 Nicotine dependence, unspecified, uncomplicated: Secondary | ICD-10-CM | POA: Insufficient documentation

## 2012-10-13 DIAGNOSIS — K645 Perianal venous thrombosis: Secondary | ICD-10-CM | POA: Insufficient documentation

## 2012-10-13 DIAGNOSIS — K644 Residual hemorrhoidal skin tags: Secondary | ICD-10-CM

## 2012-10-13 DIAGNOSIS — Z79899 Other long term (current) drug therapy: Secondary | ICD-10-CM | POA: Insufficient documentation

## 2012-10-13 DIAGNOSIS — Z8719 Personal history of other diseases of the digestive system: Secondary | ICD-10-CM | POA: Insufficient documentation

## 2012-10-13 DIAGNOSIS — K219 Gastro-esophageal reflux disease without esophagitis: Secondary | ICD-10-CM | POA: Insufficient documentation

## 2012-10-13 DIAGNOSIS — E785 Hyperlipidemia, unspecified: Secondary | ICD-10-CM | POA: Insufficient documentation

## 2012-10-13 DIAGNOSIS — K589 Irritable bowel syndrome without diarrhea: Secondary | ICD-10-CM | POA: Insufficient documentation

## 2012-10-13 DIAGNOSIS — N8111 Cystocele, midline: Secondary | ICD-10-CM | POA: Insufficient documentation

## 2012-10-13 DIAGNOSIS — Z8639 Personal history of other endocrine, nutritional and metabolic disease: Secondary | ICD-10-CM | POA: Insufficient documentation

## 2012-10-13 DIAGNOSIS — N8110 Cystocele, unspecified: Secondary | ICD-10-CM

## 2012-10-13 MED ORDER — HYDROCORTISONE 2.5 % RE CREA: TOPICAL_CREAM | RECTAL | Status: DC

## 2012-10-13 MED ORDER — HYDROCODONE-ACETAMINOPHEN 5-325 MG PO TABS: 1.0000 | ORAL_TABLET | ORAL | Status: DC | PRN

## 2012-10-13 MED ORDER — DOCUSATE SODIUM 100 MG PO CAPS: 100.0000 mg | ORAL_CAPSULE | Freq: Two times a day (BID) | ORAL | Status: DC

## 2012-10-13 NOTE — ED Notes (Signed)
Reports rectal pain/pressure x 1 week; states "something is protruding".  Saw PCP who referred her to GI specialist - has appt next week. States cannot wait d/t pain.

## 2012-10-13 NOTE — ED Provider Notes (Signed)
History     CSN: [id number]  Arrival date & time 10/13/12  1601   First MD Initiated Contact with Patient 10/13/12 1634      Chief Complaint  Patient presents with  . Rectal Pain    (Consider location/radiation/quality/duration/timing/severity/associated sxs/prior treatment) The history is provided by the patient.   patient reports developing rectal pain over the past week.  She feels something abnormal around her anus.  No fevers or chills.  No diarrhea or constipation.  No blood in her stool.  Her pain is worse with sitting.  She also reports worsening sense of "fullness" in her vaginal area over the past several months.  She states has been present for the past 2 years but seems to be worsening.  She's never had these symptoms as severe as they are now.  Her pain seems to be worse when her bladder is full.  No vaginal bleeding.  She status post hysterectomy.  Her symptoms are mild to moderate in severity.  No other complaints.  Past Medical History  Diagnosis Date  . Hyperlipemia   . GERD (gastroesophageal reflux disease)   . IBS (irritable bowel syndrome) TCS SEP 2013    NL COLON/DUO Bx  . B12 deficiency   . Atrophic gastritis EGD SEP 2013    Past Surgical History  Procedure Laterality Date  . Abdominal hysterectomy  2005    complete  . Tubal ligation    . Wisdom tooth extraction    . Colonoscopy  03/15/2012    Procedure: COLONOSCOPY;  Surgeon: West Bali, MD;  [facility];  Service: Endoscopy;  Laterality: N/A;  10:30 AM  . Esophagogastroduodenoscopy  03/15/2012    Procedure: ESOPHAGOGASTRODUODENOSCOPY (EGD);  Surgeon: West Bali, MD;  [facility];  Service: Endoscopy;  Laterality: N/A;    Family History  Problem Relation Age of Onset  . Hypertension Mother   . Hypertension Father   . Ulcers Father     PUD  . Colon cancer Maternal Grandfather 39  . Colonic polyp Son 9  . Diabetes Father   . Thyroid cancer Mother     History   Substance Use Topics  . Smoking status: Current Every Day Smoker -- 1.00 packs/day for 10 years    Types: Cigarettes  . Smokeless tobacco: Not on file  . Alcohol Use: No    OB History   Grav Para Term Preterm Abortions TAB SAB Ect Mult Living                  Review of Systems  All other systems reviewed and are negative.    Allergies  Review of patient's allergies indicates no known allergies.  Home Medications   Current Outpatient Rx  Name  Route  Sig  Dispense  Refill  . docusate sodium (COLACE) 100 MG capsule   Oral   Take 1 capsule (100 mg total) by mouth every 12 (twelve) hours.   60 capsule   0   . HYDROcodone-acetaminophen (NORCO/VICODIN) 5-325 MG per tablet   Oral   Take 1 tablet by mouth every 4 (four) hours as needed for pain.   15 tablet   0   . hydrocortisone (ANUSOL-HC) 2.5 % rectal cream      Apply rectally 2 times daily   30 g   0   . ibuprofen (ADVIL,MOTRIN) 200 MG tablet   Oral   Take 400 mg by mouth every 6 (six) hours as needed. For pain         .  omeprazole (PRILOSEC) 20 MG capsule      1 po 30 minutes prior to meals bid for 3 mos then once daily   62 capsule   11   . Probiotic Product (ALIGN) 4 MG CAPS   Oral   Take 4 mg by mouth daily.   31 capsule   11     BP 133/86  Pulse 85  Temp(Src) 99.4 F (37.4 C) (Oral)  Ht 5\' 4"  (1.626 m)  Wt 180 lb (81.647 kg)  BMI 30.88 kg/m2  SpO2 100%  Physical Exam  Nursing note and vitals reviewed. Constitutional: She is oriented to person, place, and time. She appears well-developed and well-nourished. No distress.  HENT:  Head: Normocephalic and atraumatic.  Eyes: EOM are normal.  Neck: Normal range of motion.  Cardiovascular: Normal rate, regular rhythm and normal heart sounds.   Pulmonary/Chest: Effort normal and breath sounds normal.  Abdominal: Soft. She exhibits no distension. There is no tenderness.  Genitourinary:  Small thrombosed external hemorrhoid without bleeding.   Patient with normal external genitalia.  Patient with some fullness at 12:00 on bimanual exam consistent with what feels like bladder prolapse into her vagina.  Musculoskeletal: Normal range of motion.  Neurological: She is alert and oriented to person, place, and time.  Skin: Skin is warm and dry.  Psychiatric: She has a normal mood and affect. Judgment normal.    ED Course  Procedures (including critical care time)  Labs Reviewed - No data to display No results found.   1. External hemorrhoid   2. Female bladder prolapse       MDM  External hemorrhoid and bladder prolapse and her vagina.  Follow up with GI and OB/GYN.  Home with symptomatic treatment.        Lyanne Co, MD 10/13/12 [phone]

## 2012-10-13 NOTE — ED Notes (Signed)
Patient with no complaints at this time. Respirations even and unlabored. Skin warm/dry. Discharge instructions reviewed with patient at this time. Patient given opportunity to voice concerns/ask questions. Patient discharged at this time and left Emergency Department with steady gait.   

## 2012-10-13 NOTE — ED Notes (Signed)
Pt c/o "rectal and vaginal pressure" x1 week. Pt states "something is protruding". Pt consulted with her PCP and GI doctor and has an appointment schedule in 2 weeks. Pt states symptoms have become worse and she is here for evaluation. Pt reports "off and on diarrhea and constipation". Pt denies vomiting.

## 2013-07-28 ENCOUNTER — Encounter (HOSPITAL_COMMUNITY): Payer: Self-pay | Admitting: Emergency Medicine

## 2013-07-28 ENCOUNTER — Emergency Department (HOSPITAL_COMMUNITY)
Admission: EM | Admit: 2013-07-28 | Discharge: 2013-07-28 | Disposition: A | Discharge status: Home / Self Care | Payer: Managed Care, Other (non HMO) | Attending: Emergency Medicine | Admitting: Emergency Medicine

## 2013-07-28 DIAGNOSIS — R358 Other polyuria: Secondary | ICD-10-CM | POA: Insufficient documentation

## 2013-07-28 DIAGNOSIS — J111 Influenza due to unidentified influenza virus with other respiratory manifestations: Secondary | ICD-10-CM | POA: Insufficient documentation

## 2013-07-28 DIAGNOSIS — K219 Gastro-esophageal reflux disease without esophagitis: Secondary | ICD-10-CM | POA: Insufficient documentation

## 2013-07-28 DIAGNOSIS — E785 Hyperlipidemia, unspecified: Secondary | ICD-10-CM | POA: Insufficient documentation

## 2013-07-28 DIAGNOSIS — F17200 Nicotine dependence, unspecified, uncomplicated: Secondary | ICD-10-CM | POA: Insufficient documentation

## 2013-07-28 DIAGNOSIS — R3589 Other polyuria: Secondary | ICD-10-CM | POA: Insufficient documentation

## 2013-07-28 MED ORDER — ACETAMINOPHEN 325 MG PO TABS
650.0000 mg | ORAL_TABLET | Freq: Once | ORAL | Status: AC
  Administered 2013-07-28: 650 mg via ORAL
  Filled 2013-07-28: qty 2

## 2013-07-28 MED ORDER — KETOROLAC TROMETHAMINE 60 MG/2ML IM SOLN
60.0000 mg | Freq: Once | INTRAMUSCULAR | Status: AC
  Administered 2013-07-28: 60 mg via INTRAMUSCULAR
  Filled 2013-07-28: qty 2

## 2013-07-28 MED ORDER — OSELTAMIVIR PHOSPHATE 75 MG PO CAPS: 75.0000 mg | ORAL_CAPSULE | Freq: Two times a day (BID) | ORAL | Status: DC

## 2013-07-28 MED ORDER — OSELTAMIVIR PHOSPHATE 75 MG PO CAPS
75.0000 mg | ORAL_CAPSULE | Freq: Once | ORAL | Status: AC
  Administered 2013-07-28: 75 mg via ORAL
  Filled 2013-07-28: qty 1

## 2013-07-28 NOTE — ED Notes (Signed)
Pt alert & oriented x4, stable gait. Patient given discharge instructions, paperwork & prescription(s). Patient  instructed to stop at the registration desk to finish any additional paperwork. Patient verbalized understanding. Pt left department w/ no further questions. 

## 2013-07-28 NOTE — ED Notes (Signed)
Pt c/o generalized body aches, cough, fever x2-3 days.

## 2013-07-28 NOTE — ED Provider Notes (Signed)
CSN: [id number]     Arrival date & time 07/28/13  1324 History  This chart was scribed for Flint Melter, MD by Shari Heritage, ED Scribe. The patient was seen in room [facility]07/[facility]07. Patient's care was started at 3:11 PM.      Chief Complaint  Patient presents with  . Influenza    The history is provided by the patient. No language interpreter was used.    HPI Comments: Vanessa Lamb is a 38 y.o. female who presents to the Emergency Department complaining of nonproductive cough onset 3-4 days ago. There is associated body aches, headache, sneezing and subjective fever. Patient states that her symptoms began to worsen last night. She has been taking Mucinex at home without relief. She also took Nyquil this morning at about 5 AM. She hasn't had any medications for fever relief. Patient has had a flu shot this season. She has a history of hyperlipemia and is treated with Zetia 10 mg daily.   Past Medical History  Diagnosis Date  . Hyperlipemia   . GERD (gastroesophageal reflux disease)   . IBS (irritable bowel syndrome) TCS SEP 2013    NL COLON/DUO Bx  . B12 deficiency   . Atrophic gastritis EGD SEP 2013   Past Surgical History  Procedure Laterality Date  . Abdominal hysterectomy  2005    complete  . Tubal ligation    . Wisdom tooth extraction    . Colonoscopy  03/15/2012    Procedure: COLONOSCOPY;  Surgeon: West Bali, MD;  [facility];  Service: Endoscopy;  Laterality: N/A;  10:30 AM  . Esophagogastroduodenoscopy  03/15/2012    Procedure: ESOPHAGOGASTRODUODENOSCOPY (EGD);  Surgeon: West Bali, MD;  [facility];  Service: Endoscopy;  Laterality: N/A;   Family History  Problem Relation Age of Onset  . Hypertension Mother   . Hypertension Father   . Ulcers Father     PUD  . Colon cancer Maternal Grandfather 5  . Colonic polyp Son 9  . Diabetes Father   . Thyroid cancer Mother    History  Substance Use Topics  . Smoking status: Current Every Day  Smoker -- 1.00 packs/day for 10 years    Types: Cigarettes  . Smokeless tobacco: Not on file  . Alcohol Use: No  She works at [employer] in [place]  OB History   Grav Para Term Preterm Abortions TAB SAB Ect Mult Living                 Review of Systems  Constitutional: Fever: subjective.  HENT: Positive for sneezing.   Respiratory: Positive for cough.   Endocrine: Positive for polyuria.  Musculoskeletal: Positive for myalgias.  Neurological: Positive for headaches.  All other systems reviewed and are negative.    Allergies  Review of patient's allergies indicates no known allergies.  Home Medications   Current Outpatient Rx  Name  Route  Sig  Dispense  Refill  . ezetimibe (ZETIA) 10 MG tablet   Oral   Take 10 mg by mouth every evening.         . pantoprazole (PROTONIX) 40 MG tablet   Oral   Take 40 mg by mouth every morning.         . Pseudoeph-Doxylamine-DM-APAP (DAYQUIL/NYQUIL COLD/FLU RELIEF PO)   Oral   Take 2 capsules by mouth every 4 (four) hours as needed (cold/cough/flu like symptoms).         . simvastatin (ZOCOR) 40 MG tablet  Oral   Take 40 mg by mouth every evening.         Marland Kitchen. oseltamivir (TAMIFLU) 75 MG capsule   Oral   Take 1 capsule (75 mg total) by mouth every 12 (twelve) hours.   10 capsule   0    Triage Vitals: BP 123/76  Pulse 121  Temp(Src) 99.6 F (37.6 C) (Oral)  Resp 20  Ht 5\' 4"  (1.626 m)  Wt 180 lb (81.647 kg)  BMI 30.88 kg/m2  SpO2 97% Physical Exam  Nursing note and vitals reviewed. Constitutional: She is oriented to person, place, and time. She appears well-developed and well-nourished.  HENT:  Head: Normocephalic and atraumatic.  Right Ear: External ear normal.  Left Ear: External ear normal.  Eyes: Conjunctivae and EOM are normal. Pupils are equal, round, and reactive to light.  Neck: Normal range of motion and phonation normal. Neck supple.  No meningismus.  Cardiovascular: Normal rate, regular rhythm,  normal heart sounds and intact distal pulses.   No murmur heard. Pulmonary/Chest: Effort normal and breath sounds normal. No respiratory distress. She has no wheezes. She has no rales. She exhibits no bony tenderness.  Abdominal: Soft. Normal appearance. There is no tenderness.  Musculoskeletal: Normal range of motion.  Neurological: She is alert and oriented to person, place, and time. No cranial nerve deficit or sensory deficit. She exhibits normal muscle tone. Coordination normal.  Skin: Skin is warm, dry and intact.  Psychiatric: She has a normal mood and affect. Her behavior is normal. Judgment and thought content normal.    ED Course  Procedures (including critical care time)  Medications  oseltamivir (TAMIFLU) capsule 75 mg (75 mg Oral Given 07/28/13 1524)  ketorolac (TORADOL) injection 60 mg (60 mg Intramuscular Given 07/28/13 1524)  acetaminophen (TYLENOL) tablet 650 mg (650 mg Oral Given 07/28/13 1524)    Patient Vitals for the past 24 hrs:  BP Temp Temp src Pulse Resp SpO2 Height Weight  07/28/13 1632 114/70 mmHg 98.7 F (37.1 C) Oral 103 18 98 % - -  07/28/13 1417 123/76 mmHg 99.6 F (37.6 C) - 121 20 97 % - -  07/28/13 1326 131/81 mmHg 99.6 F (37.6 C) Oral 134 16 96 % 5\' 4"  (1.626 m) 180 lb (81.647 kg)   3:14 PM- Will order Toradol and Tylenol for symptom relief. She has opted for treatment with Tamiflu to improve suspected flu symptoms so first dose will be given today.Patient informed of current plan for treatment and evaluation and agrees with plan at this time.   6:10 PM Reevaluation with update and discussion. After initial assessment and treatment, an updated evaluation reveals she is tolerating fluids. Her vital signs have normalized. She feels better at this time. Susano Cleckler L   MDM   1. Influenza    Nonspecific symptoms, possibly consistent with influenza. She is at low risk for complications from influenza.   Nursing Notes Reviewed/ Care  Coordinated Applicable Imaging Reviewed Interpretation of Laboratory Data incorporated into ED treatment  The patient appears reasonably screened and/or stabilized for discharge and I doubt any other medical condition or other [facility] requiring further screening, evaluation, or treatment in the ED at this time prior to discharge.  Plan: Home Medications- Tamiflu analgesia; Home Treatments- rest, fluid; return here if the recommended treatment, does not improve the symptoms; Recommended follow up- PCP, when necessary  I personally performed the services described in this documentation, which was scribed in my presence. The recorded information has been reviewed and is  accurate.     Flint Melter, MD 07/28/13 [id number]

## 2013-07-28 NOTE — Discharge Instructions (Signed)
Alternate Tylenol and Motrin every 4 hours for fever or pain. Drink plenty of fluids. See your doctor as needed for problems.     Influenza, Adult Influenza ("the flu") is a viral infection of the respiratory tract. It occurs more often in winter months because people spend more time in close contact with one another. Influenza can make you feel very sick. Influenza easily spreads from person to person (contagious). CAUSES  Influenza is caused by a virus that infects the respiratory tract. You can catch the virus by breathing in droplets from an infected person's cough or sneeze. You can also catch the virus by touching something that was recently contaminated with the virus and then touching your mouth, nose, or eyes. SYMPTOMS  Symptoms typically last 4 to 10 days and may include:  Fever.  Chills.  Headache, body aches, and muscle aches.  Sore throat.  Chest discomfort and cough.  Poor appetite.  Weakness or feeling tired.  Dizziness.  Nausea or vomiting. DIAGNOSIS  Diagnosis of influenza is often made based on your history and a physical exam. A nose or throat swab test can be done to confirm the diagnosis. RISKS AND COMPLICATIONS You may be at risk for a more severe case of influenza if you smoke cigarettes, have diabetes, have chronic heart disease (such as heart failure) or lung disease (such as asthma), or if you have a weakened immune system. Elderly people and pregnant women are also at risk for more serious infections. The most common complication of influenza is a lung infection (pneumonia). Sometimes, this complication can require emergency medical care and may be life-threatening. PREVENTION  An annual influenza vaccination (flu shot) is the best way to avoid getting influenza. An annual flu shot is now routinely recommended for all adults in the [place] TREATMENT  In mild cases, influenza goes away on its own. Treatment is directed at relieving symptoms. For more  severe cases, your caregiver may prescribe antiviral medicines to shorten the sickness. Antibiotic medicines are not effective, because the infection is caused by a virus, not by bacteria. HOME CARE INSTRUCTIONS  Only take over-the-counter or prescription medicines for pain, discomfort, or fever as directed by your caregiver.  Use a cool mist humidifier to make breathing easier.  Get plenty of rest until your temperature returns to normal. This usually takes 3 to 4 days.  Drink enough fluids to keep your urine clear or pale yellow.  Cover your mouth and nose when coughing or sneezing, and wash your hands well to avoid spreading the virus.  Stay home from work or school until your fever has been gone for at least 1 full day. SEEK MEDICAL CARE IF:   You have chest pain or a deep cough that worsens or produces more mucus.  You have nausea, vomiting, or diarrhea. SEEK IMMEDIATE MEDICAL CARE IF:   You have difficulty breathing, shortness of breath, or your skin or nails turn bluish.  You have severe neck pain or stiffness.  You have a severe headache, facial pain, or earache.  You have a worsening or recurring fever.  You have nausea or vomiting that cannot be controlled. MAKE SURE YOU:  Understand these instructions.  Will watch your condition.  Will get help right away if you are not doing well or get worse. Document Released: 06/18/2000 Document Revised: 12/21/2011 Document Reviewed: 09/20/2011 [facility] Patient Information 2014 [place], [place].

## 2013-10-01 ENCOUNTER — Other Ambulatory Visit (HOSPITAL_COMMUNITY): Payer: Self-pay | Admitting: Internal Medicine

## 2013-10-01 DIAGNOSIS — R51 Headache: Secondary | ICD-10-CM

## 2013-10-03 ENCOUNTER — Ambulatory Visit (HOSPITAL_COMMUNITY)
Admission: RE | Admit: 2013-10-03 | Discharge: 2013-10-03 | Disposition: A | Discharge status: Home / Self Care | Payer: Managed Care, Other (non HMO) | Source: Ambulatory Visit | Attending: Internal Medicine | Admitting: Internal Medicine

## 2013-10-03 ENCOUNTER — Ambulatory Visit (HOSPITAL_COMMUNITY): Payer: Managed Care, Other (non HMO)

## 2013-10-03 DIAGNOSIS — H538 Other visual disturbances: Secondary | ICD-10-CM | POA: Insufficient documentation

## 2013-10-03 DIAGNOSIS — G43909 Migraine, unspecified, not intractable, without status migrainosus: Secondary | ICD-10-CM | POA: Insufficient documentation

## 2013-10-03 DIAGNOSIS — R51 Headache: Secondary | ICD-10-CM

## 2013-10-03 DIAGNOSIS — R209 Unspecified disturbances of skin sensation: Secondary | ICD-10-CM | POA: Insufficient documentation

## 2013-10-03 DIAGNOSIS — G939 Disorder of brain, unspecified: Secondary | ICD-10-CM | POA: Insufficient documentation

## 2013-10-04 ENCOUNTER — Other Ambulatory Visit (HOSPITAL_COMMUNITY): Payer: Managed Care, Other (non HMO)

## 2013-10-08 ENCOUNTER — Telehealth: Payer: Self-pay | Admitting: Neurology

## 2013-10-08 NOTE — Telephone Encounter (Signed)
Called pt and scheduled an appt with Dr. Terrace ArabiaYan on 11/02/13. Pt was last seen on 08/20/11, in [facility]. I advised the pt that if she has any other problems, questions or concerns to call the office. Pt verbalized understanding.

## 2013-11-01 ENCOUNTER — Encounter: Payer: Self-pay | Admitting: Neurology

## 2013-11-02 ENCOUNTER — Encounter: Payer: Self-pay | Admitting: Neurology

## 2013-11-02 ENCOUNTER — Encounter (INDEPENDENT_AMBULATORY_CARE_PROVIDER_SITE_OTHER): Payer: Self-pay

## 2013-11-02 ENCOUNTER — Ambulatory Visit (INDEPENDENT_AMBULATORY_CARE_PROVIDER_SITE_OTHER): Payer: Managed Care, Other (non HMO) | Admitting: Neurology

## 2013-11-02 VITALS — BP 138/84 | HR 90 | Ht 64.0 in | Wt 188.0 lb

## 2013-11-02 DIAGNOSIS — F329 Major depressive disorder, single episode, unspecified: Secondary | ICD-10-CM

## 2013-11-02 DIAGNOSIS — F3289 Other specified depressive episodes: Secondary | ICD-10-CM

## 2013-11-02 DIAGNOSIS — F32A Depression, unspecified: Secondary | ICD-10-CM | POA: Insufficient documentation

## 2013-11-02 DIAGNOSIS — G43909 Migraine, unspecified, not intractable, without status migrainosus: Secondary | ICD-10-CM

## 2013-11-02 MED ORDER — RIZATRIPTAN BENZOATE 5 MG PO TBDP: 5.0000 mg | ORAL_TABLET | ORAL | Status: DC | PRN

## 2013-11-02 MED ORDER — VENLAFAXINE HCL ER 37.5 MG PO CP24: ORAL_CAPSULE | ORAL | Status: DC

## 2013-11-02 NOTE — Progress Notes (Signed)
PATIENT: Vanessa Lamb DOB: 1976/07/03  HISTORICAL  Meira R Shugars is a 38 years old left-handed female, referred by her primary care physician Dr. Sherwood Gambler for evaluation of facial droop, headaches, feeling tired, headshaking, leg shaking.  I saw her previously in February 2013,  She has past medical history of total hysterectomy, on hormone supplement, B12 deficiency, hyperlipidemia,  She worked at a shift at work at [employer], over past 2 years, there was no clear triggering event, she began to notice get mad easily, racing thoughts, difficulty turning her mind off, disinterested in activity, excessive fatigue, to the point of affecting her job performance in recent 6 month, she has to squat down during her work, she complains of lightheadedness, bilateral feet and fingertips numbness, when she squatting down, she has 8 years child at home, 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 These findings are non-specific and considerations include autoimmune, inflammatory, post-infectious, microvascular ischemic or migraine associated etiologies.   Lab showed cholesterol 285, LDL 219, B12 279, Folic acid 7.1.  She noticed that she has bitten her right jaw, with her feeling it, right facial droopy, she has no LOC. She also complains episode of droopy right eye lid,  especially the right side, with associated right sites [place],  She has headaches now, most  on her left side, she slurred, does not want anybody to be around her.  She lost interest in many things.   We had reviewed her most recent MRI of the brain, in April 2015 together, periventricular  and scattered subcortical T2 hyperintensities are  slightly greater than expected for age. There is some involvement of the anterior colossal septal margin on the right. The lesions are otherwise predominantly subcortical. The finding is nonspecific but can be seen in the setting of chronic microvascular ischemia, complicated migraines, at the above described abnormality was seen previous MRI     REVIEW OF SYSTEMS: Full 14 system review of systems performed and notable only for activity change, fatigue, trouble swallowing, choking, cold intolerance, heat intolerance, excessive thirst, constipation, frequent awakening, daytime sleepiness, difficulty urination, painful urinate, decreased urination, muscle cramps, opening difficulties, anemia, memory loss, dizziness, headaches, numbness, speech difficulty, weakness, tremor, facial droopy, confusion, decreased concentration, depression, anxiety  ALLERGIES: No Known Allergies  HOME MEDICATIONS: Current Outpatient Prescriptions on File Prior to Visit  Medication Sig Dispense Refill  . amitriptyline (ELAVIL) 50 MG tablet Take 50 mg by mouth at bedtime.      Marland Kitchen ezetimibe (ZETIA) 10 MG tablet Take 10 mg by mouth every evening.      . pantoprazole (PROTONIX) 40 MG tablet Take 40 mg by mouth every morning.      . simvastatin (ZOCOR) 40 MG tablet Take 40 mg by mouth every evening.       No current facility-administered medications on file prior to visit.    PAST MEDICAL HISTORY: Past Medical History  Diagnosis Date  . Hyperlipemia   . GERD (gastroesophageal reflux disease)   . IBS (irritable bowel syndrome) TCS SEP 2013    NL COLON/DUO Bx  . B12 deficiency   . Atrophic gastritis EGD SEP 2013  PAST SURGICAL HISTORY: Past Surgical History  Procedure Laterality Date  . Abdominal hysterectomy  2005    complete  . Tubal ligation    . Wisdom tooth extraction    . Colonoscopy  03/15/2012    Procedure: COLONOSCOPY;  Surgeon: West Bali, MD;   [facility];  Service: Endoscopy;  Laterality: N/A;  10:30 AM  . Esophagogastroduodenoscopy  03/15/2012    Procedure: ESOPHAGOGASTRODUODENOSCOPY (EGD);  Surgeon: West Bali, MD;  [facility];  Service: Endoscopy;  Laterality: N/A;    FAMILY HISTORY: Family History  Problem Relation Age of Onset  . Hypertension Mother   . Hypertension Father   . Ulcers Father     PUD  . Colon cancer Maternal Grandfather 59  . Colonic polyp Son 9  . Diabetes Father   . Thyroid cancer Mother     SOCIAL HISTORY:  History   Social History  . Marital Status: Single    Spouse Name: N/A    Number of Children: 2  . Years of Education: 12   Occupational History  . machine op [employer]   Social History Main Topics  . Smoking status: Current Every Day Smoker -- 1.00 packs/day for 10 years    Types: Cigarettes  . Smokeless tobacco: Never Used  . Alcohol Use: No  . Drug Use: No  . Sexual Activity: Not on file   Other Topics Concern  . Not on file   Social History Narrative   Patient is single. And  lives at home with her children.    Patient works full time At [employer].   Education high school.   Left handed.   Caffeine soda  Four daily.              PHYSICAL EXAM   Filed Vitals:   11/02/13 0859  BP: 138/84  Pulse: 90  Height: 5\' 4"  (1.626 m)  Weight: 188 lb (85.276 kg)    Not recorded    Body mass index is 32.25 kg/(m^2).   Generalized: In no acute distress  Neck: Supple, no carotid bruits   Cardiac: Regular rate rhythm  Pulmonary: Clear to auscultation bilaterally  Musculoskeletal: No deformity  Neurological examination  Mentation: Alert oriented to time, place, history taking, and causual conversation  Cranial nerve II-XII: Pupils were equal round reactive to light. Extraocular movements were full.  Visual field were full on confrontational test. Bilateral fundi were sharp.  Facial sensation and strength were normal.  Hearing was intact to finger rubbing bilaterally. Uvula tongue midline.  Head turning and shoulder shrug and were normal and symmetric.Tongue protrusion into cheek strength was normal.  Motor: Normal tone, bulk and strength.  Sensory: Intact to fine touch, pinprick, preserved vibratory sensation, and proprioception at toes.  Coordination: Normal finger to nose, heel-to-shin bilaterally there was no truncal ataxia  Gait: Rising up from seated position without assistance, normal stance, without trunk ataxia, moderate stride, good arm swing, smooth turning, able to perform tiptoe, and heel walking without difficulty.   Romberg signs: Negative  Deep tendon reflexes: Brachioradialis 2/2, biceps 2/2, triceps 2/2, patellar 2/2, Achilles 2/2, plantar responses were flexor bilaterally.   DIAGNOSTIC DATA (LABS, IMAGING, TESTING) - I reviewed patient records, labs, notes, testing and imaging myself where available.  Lab Results  Component Value Date   WBC 7.8 02/28/2012   HGB 12.9 02/28/2012   HCT 37.3 02/28/2012   MCV 92.6 02/28/2012   PLT 150 02/28/2012  Component Value Date/Time   NA 139 02/29/2012 0551   K 3.8 02/29/2012 0551   CL 109 02/29/2012 0551   CO2 24 02/29/2012 0551   GLUCOSE 104* 02/29/2012 0551   BUN <3* 02/29/2012 0551   CREATININE 0.62 02/29/2012 0551   CALCIUM 8.8 02/29/2012 0551   PROT 8.1 02/27/2012 1755   ALBUMIN 4.2 02/27/2012 1755   AST 14 02/27/2012 1755   ALT 12 02/27/2012 1755   ALKPHOS 84 02/27/2012 1755   BILITOT 0.4 02/27/2012 1755   GFRNONAA >90 02/29/2012 0551   GFRAA >90 02/29/2012 0551    ASSESSMENT AND PLAN  Makaila R Marthenia Rollingchols is a 38 y.o. female complains of  increased frequency of headaches, depression, also constellation of other symptoms, essentially normal neurological examination, I have reviewed her driver's license, which showed mild asymmetry of her face, including small eye slit on the right side.  1. I am not sure exact etiology, differentiation diagnosis  including depression, migraine headaches, 2 laboratory evaluation, including acetylcholine receptor antibodies, to rule out neuromuscular junctional disorder, 3 EEG.     Levert FeinsteinYijun Daylene Vandenbosch, M.D. Ph.D.  [facility] [address], [address] [place], [place] [address] [phone]

## 2013-11-05 ENCOUNTER — Telehealth: Payer: Self-pay | Admitting: Neurology

## 2013-11-05 NOTE — Telephone Encounter (Signed)
Pt called concerning the results from her lab work last Friday. Please call pt concerning this matter. Thanks

## 2013-11-06 NOTE — Telephone Encounter (Signed)
Please call patient, laboratory revealed, low-normal B12 263, mildly low TSH, but with normal T3-T4 levels,  I have faxed a laboratory result to her primary care physician Dr. Sherwood Gambler, she will continue followup with her primary care physician, she may take over-the-counter vitamin B12 supplement, 1000 microgram 1 tablet every day

## 2013-11-06 NOTE — Telephone Encounter (Signed)
Pt called back is wanting someone to call her concerning her results especially on the B12. Pt states you may leave a vm on her cell phone concerning her results. Thanks

## 2013-11-06 NOTE — Telephone Encounter (Signed)
Called and left VM message that results have not been reviewed by physician as of yet, will call back once read by Dr Terrace Arabia

## 2013-11-09 ENCOUNTER — Institutional Professional Consult (permissible substitution): Payer: Self-pay | Admitting: Neurology

## 2013-11-09 ENCOUNTER — Ambulatory Visit (INDEPENDENT_AMBULATORY_CARE_PROVIDER_SITE_OTHER): Payer: Managed Care, Other (non HMO)

## 2013-11-09 DIAGNOSIS — F32A Depression, unspecified: Secondary | ICD-10-CM

## 2013-11-09 DIAGNOSIS — G43909 Migraine, unspecified, not intractable, without status migrainosus: Secondary | ICD-10-CM

## 2013-11-09 DIAGNOSIS — F329 Major depressive disorder, single episode, unspecified: Secondary | ICD-10-CM

## 2013-11-09 DIAGNOSIS — F070 Personality change due to known physiological condition: Secondary | ICD-10-CM

## 2013-11-09 DIAGNOSIS — R42 Dizziness and giddiness: Secondary | ICD-10-CM

## 2013-11-09 LAB — COMPREHENSIVE METABOLIC PANEL
A/G RATIO: 2.1 (ref 1.1–2.5)
ALK PHOS: 85 IU/L (ref 39–117)
ALT: 15 IU/L (ref 0–32)
AST: 16 IU/L (ref 0–40)
Albumin: 4.6 g/dL (ref 3.5–5.5)
BUN / CREAT RATIO: 7 — AB (ref 8–20)
BUN: 5 mg/dL — ABNORMAL LOW (ref 6–20)
CALCIUM: 9.8 mg/dL (ref 8.7–10.2)
CO2: 24 mmol/L (ref 18–29)
CREATININE: 0.71 mg/dL (ref 0.57–1.00)
Chloride: 104 mmol/L (ref 97–108)
GFR calc Af Amer: 126 mL/min/{1.73_m2} (ref >59)
GFR, EST NON AFRICAN AMERICAN: 109 mL/min/{1.73_m2} (ref >59)
GLOBULIN, TOTAL: 2.2 g/dL (ref 1.5–4.5)
Glucose: 84 mg/dL (ref 65–99)
POTASSIUM: 4.7 mmol/L (ref 3.5–5.2)
SODIUM: 143 mmol/L (ref 134–144)
Total Bilirubin: 0.2 mg/dL (ref 0.0–1.2)
Total Protein: 6.8 g/dL (ref 6.0–8.5)

## 2013-11-09 LAB — CBC WITH DIFFERENTIAL
Basophils Absolute: 0 10*3/uL (ref 0.0–0.2)
Basos: 0 %
EOS ABS: 0.1 10*3/uL (ref 0.0–0.4)
EOS: 2 %
HCT: 43 % (ref 34.0–46.6)
Hemoglobin: 14.8 g/dL (ref 11.1–15.9)
IMMATURE GRANS (ABS): 0 10*3/uL (ref 0.0–0.1)
IMMATURE GRANULOCYTES: 0 %
LYMPHS ABS: 2.8 10*3/uL (ref 0.7–3.1)
Lymphs: 37 %
MCH: 32.2 pg (ref 26.6–33.0)
MCHC: 34.4 g/dL (ref 31.5–35.7)
MCV: 94 fL (ref 79–97)
MONOS ABS: 0.4 10*3/uL (ref 0.1–0.9)
Monocytes: 5 %
NEUTROS PCT: 56 %
Neutrophils Absolute: 4.2 10*3/uL (ref 1.4–7.0)
PLATELETS: 256 10*3/uL (ref 150–379)
RBC: 4.6 x10E6/uL (ref 3.77–5.28)
RDW: 13.3 % (ref 12.3–15.4)
WBC: 7.5 10*3/uL (ref 3.4–10.8)

## 2013-11-09 LAB — THYROID PANEL WITH TSH
Free Thyroxine Index: 2.1 (ref 1.2–4.9)
T3 UPTAKE RATIO: 25 % (ref 24–39)
T4 TOTAL: 8.2 ug/dL (ref 4.5–12.0)
TSH: 0.442 u[IU]/mL — AB (ref 0.450–4.500)

## 2013-11-09 LAB — METHYLMALONIC ACID(MMA), RND URINE
METHYLMALONIC ACID UR: 15.5 umol/L (ref 1.6–29.7)
MMA - Normalized: 1.9 umol/mmol cr (ref 0.4–2.5)

## 2013-11-09 LAB — HOMOCYSTEINE: HOMOCYSTEINE: 8 umol/L (ref 0.0–15.0)

## 2013-11-09 LAB — ACETYLCHOLINE RECEPTOR, BINDING: AChR Binding Ab, Serum: <0.03 nmol/L (ref 0.00–0.24)

## 2013-11-09 LAB — ANA W/REFLEX IF POSITIVE: Anti Nuclear Antibody(ANA): NEGATIVE

## 2013-11-09 LAB — VITAMIN B12: Vitamin B-12: 263 pg/mL (ref 211–946)

## 2013-11-09 LAB — ACETYLCHOLINE RECEPTOR, MODULATING: Acetylcholine Rec Mod Ab: <12 % (ref 0–20)

## 2013-11-09 LAB — FOLATE: Folate: 15.5 ng/mL (ref >3.0)

## 2013-11-09 NOTE — Procedures (Signed)
   HISTORY:  38 year old female, with history of migraine, complains of dizziness, memory loss, facial droop,  TECHNIQUE:  16 channel EEG was performed based on standard 10-16 international system. One channel was dedicated to EKG, which has demonstrates normal sinus rhythm of 78 beats per minutes.  Upon awakening, the posterior background activity was well-developed, in alpha range, 11 Hz,  reactive to eye opening and closure.  There was no evidence of epilepsy form discharge.  Photic stimulation was performed, which induced a symmetric photic driving.  Hyperventilation was performed, there was no abnormality elicit.  No sleep was achieved.  CONCLUSION: This is a  normal awake EEG.  There is no electrodiagnostic evidence of epileptiform discharge

## 2013-11-12 NOTE — Telephone Encounter (Signed)
I called and LMVM for pt to return call re: lab results and recommendations.

## 2013-11-12 NOTE — Telephone Encounter (Signed)
Pt returned call.  I relayed the message per Dr. Terrace ArabiaYan below.  She takes B12 injections already per Dr. Sherwood GamblerFusco.  I would refax to Dr. Sherwood GamblerFusco results.  I offered RV with Dr. Terrace ArabiaYan to go over results and she declined at this time.  She will see her pcp again.  ? Cause of her sx.  Reiterated making RV she said no.

## 2013-11-20 NOTE — Telephone Encounter (Signed)
Doctor has been made aware.

## 2014-05-06 ENCOUNTER — Ambulatory Visit: Payer: Self-pay | Admitting: Adult Health

## 2014-05-17 ENCOUNTER — Ambulatory Visit (INDEPENDENT_AMBULATORY_CARE_PROVIDER_SITE_OTHER): Payer: Managed Care, Other (non HMO) | Admitting: Neurology

## 2014-05-17 ENCOUNTER — Encounter: Payer: Self-pay | Admitting: Neurology

## 2014-05-17 VITALS — BP 120/78 | HR 80 | Temp 97.0°F | Resp 16 | Ht 64.0 in | Wt 197.0 lb

## 2014-05-17 DIAGNOSIS — G43009 Migraine without aura, not intractable, without status migrainosus: Secondary | ICD-10-CM

## 2014-05-17 DIAGNOSIS — R292 Abnormal reflex: Secondary | ICD-10-CM

## 2014-05-17 DIAGNOSIS — R930 Abnormal findings on diagnostic imaging of skull and head, not elsewhere classified: Secondary | ICD-10-CM

## 2014-05-17 NOTE — Progress Notes (Signed)
NEUROLOGY CONSULTATION NOTE  Vanessa Lamb MRN: [id number] DOB: May 05, 1976  Referring provider: Dr. Phillips Odor Primary care provider: Dr. Sherwood Gambler  Reason for consult:  Migraine  HISTORY OF PRESENT ILLNESS: Vanessa Lamb is a 38 year old left-handed woman with hyperlipidemia, depression, B12 deficiency, and total hysterectomy on hormone replacement who presents for headache.  Records, labs and MRI of the brain reviewed.  Onset:  38 years old Location:  Varies, but usually on the top front of her head Quality:  Throbbing, sharp Intensity:  8/10 Aura:  no Prodrome:  no Associated symptoms:  Nausea, photophobia, phonophobia, blurred vision.   Duration:  All day Frequency:  2x/month Triggers/exacerbating factors:  No triggers Relieving factors:  none Activity:  Able to force self to function.  She was previously on amitriptyline 50mg , which helped but caused episodes of shortness of breath at night.  She is no longer on a preventative. She takes Excedrin for the headaches which seem to work.  She has left facial weakness and numbness, sometimes not associated with headache.  It can last up to 1-2 days.  It occurs once a week.   She also reports word-finding difficulties and unsteady gait.  Also, her right leg feels shaky when she is walking down stairs.  She had an MRI of the brain without contrast performed on 10/03/13, which showed periventricular and subcortical T2 hyperintensities seen, with some involvement of the anterior colossal septal margin on the right.  She was evaluated by Dr. Terrace Arabia at [facility] in May.  Labs performed included TSH 0.442, T4 8.2, ANA negative, AChR antibodies negative, B12 263, methylmalonic acid level 15.5, folate 15.5, and homocysteine 8.  An EEG was reportedly normal.  Facial weakness was attributed to migraines.  She had been started on B12 injections, however, it does not seem to be effective.    PAST MEDICAL HISTORY: Past Medical History    Diagnosis Date  . Hyperlipemia   . GERD (gastroesophageal reflux disease)   . IBS (irritable bowel syndrome) TCS SEP 2013    NL COLON/DUO Bx  . B12 deficiency   . Atrophic gastritis EGD SEP 2013  . Headache     PAST SURGICAL HISTORY: Past Surgical History  Procedure Laterality Date  . Abdominal hysterectomy  2005    complete  . Tubal ligation    . Wisdom tooth extraction    . Colonoscopy  03/15/2012    Procedure: COLONOSCOPY;  Surgeon: West Bali, MD;  [facility]: [facility];  Service: Endoscopy;  Laterality: N/A;  10:30 AM  . Esophagogastroduodenoscopy  03/15/2012    Procedure: ESOPHAGOGASTRODUODENOSCOPY (EGD);  Surgeon: West Bali, MD;  Location: [facility];  Service: Endoscopy;  Laterality: N/A;    MEDICATIONS: Current Outpatient Prescriptions on File Prior to Visit  Medication Sig Dispense Refill  . amitriptyline (ELAVIL) 50 MG tablet Take 50 mg by mouth at bedtime.    Marland Kitchen aspirin-acetaminophen-caffeine (EXCEDRIN MIGRAINE) 250-250-65 MG per tablet Take by mouth every 6 (six) hours as needed for headache.    . ezetimibe (ZETIA) 10 MG tablet Take 10 mg by mouth every evening.    . pantoprazole (PROTONIX) 40 MG tablet Take 40 mg by mouth every morning.    . rizatriptan (MAXALT-MLT) 5 MG disintegrating tablet Take 1 tablet (5 mg total) by mouth as needed for migraine. May repeat in 2 hours if needed 15 tablet 12  . simvastatin (ZOCOR) 40 MG tablet Take 40 mg by mouth every evening.    Marland Kitchen  venlafaxine XR (EFFEXOR XR) 37.5 MG 24 hr capsule One po qam xone week, then 2 tabs po qam 60 capsule 12   No current facility-administered medications on file prior to visit.    ALLERGIES: No Known Allergies  FAMILY HISTORY: Family History  Problem Relation Age of Onset  . Hypertension Mother   . Hypertension Father   . Ulcers Father     PUD  . Colon cancer Maternal Grandfather 50  . Colonic polyp Son 9  . Diabetes Father   . Thyroid cancer Mother     SOCIAL  HISTORY: History   Social History  . Marital Status: Single    Spouse Name: N/A    Number of Children: 2  . Years of Education: 12   Occupational History  . machine op [employer]   Social History Main Topics  . Smoking status: Current Every Day Smoker -- 1.00 packs/day for 10 years    Types: Cigarettes  . Smokeless tobacco: Never Used     Comment: patient is aware she needs to stop   . Alcohol Use: No  . Drug Use: No  . Sexual Activity: No   Other Topics Concern  . Not on file   Social History Narrative   Patient is single. And  lives at home with her children.    Patient works full time At [employer].   Education high school.   Left handed.   Caffeine soda  Four daily.             REVIEW OF SYSTEMS: Constitutional: No fevers, chills, or sweats, no generalized fatigue, change in appetite Eyes: No visual changes, double vision, eye pain Ear, nose and throat: No hearing loss, ear pain, nasal congestion, sore throat Cardiovascular: No chest pain, palpitations Respiratory:  No shortness of breath at rest or with exertion, wheezes GastrointestinaI: No nausea, vomiting, diarrhea, abdominal pain, fecal incontinence Genitourinary:  No dysuria, urinary retention or frequency Musculoskeletal:  No neck pain, back pain Integumentary: No rash, pruritus, skin lesions Neurological: as above Psychiatric: No depression, insomnia, anxiety Endocrine: No palpitations, fatigue, diaphoresis, mood swings, change in appetite, change in weight, increased thirst Hematologic/Lymphatic:  No anemia, purpura, petechiae. Allergic/Immunologic: no itchy/runny eyes, nasal congestion, recent allergic reactions, rashes  PHYSICAL EXAM: Filed Vitals:   05/17/14 0921  BP: 120/78  Pulse: 80  Temp: 97 F (36.1 C)  Resp: 16   General: No acute distress Head:  Normocephalic/atraumatic Eyes:  fundi unremarkable, without vessel changes, exudates, hemorrhages or papilledema. CN III, IV,  VI:  full range of motion, no nystagmus, no ptosis Neck: supple, no paraspinal tenderness, full range of motion Back: No paraspinal tenderness Heart: regular rate and rhythm Lungs: Clear to auscultation bilaterally. Vascular: No carotid bruits. Neurological Exam: Mental status: alert and oriented to person, place, and time, recent and remote memory intact, fund of knowledge intact, attention and concentration intact, speech fluent and not dysarthric, language intact. Cranial nerves: CN I: not tested CN II: pupils equal, round and reactive to light, visual fields intact, fundi unremarkable, without vessel changes, exudates, hemorrhages or papilledema. CN III, IV, VI:  full range of motion, no nystagmus, no ptosis CN V: facial sensation intact CN VII: upper and lower face symmetric CN VIII: hearing intact CN IX, X: gag intact, uvula midline CN XI: sternocleidomastoid and trapezius muscles intact CN XII: tongue midline Bulk & Tone: normal, no fasciculations. Motor:  5/5 throughout Sensation:  Temperature and vibration intact Deep Tendon Reflexes:  2+ throughout, right  toe upgoing, left toe downgoing Finger to nose testing:  No dysmetria Heel to shin:  No dysmetria Gait:  Normal station and stride.  Able to turn and walk in tandem. Romberg negative.  IMPRESSION: Migraine without aura.  I do believe the transient right facial weakness is migraine-related.  However, I cannot explain the right Babinski.  Further evaluation is warranted.  PLAN: 1.  MRI of the brain, cervical spine and thoracic spine with and without contrast. 2.  Consider LP 3.  Will try amitriptyline in the morning.  If not tolerating, then will switch to nortriptyline 50mg . 4.  Follow up in 3 months  Thank you for allowing me to take part in the care of this patient.  Shon MilletAdam Jaffe, DO  CC:  Elfredia NevinsLawrence Fusco, MD  Assunta FoundJohn Golding, MD

## 2014-05-17 NOTE — Patient Instructions (Addendum)
It may be migraines, but I think we should definitely do further tests. 1.  We will get MRI of the brain, cervical and thoracic spine with and without contrast. [facility]  [address] [place]  Telephone [phone] 05/24/14 @9 :00am 2.  We may need to do a spinal tap as well.  We will contact you regarding this. 3.  In the meantime, try the amitriptyline in the morning.  If you still have side effects, call and we can switch to something else 4.  Follow up in 3 months.

## 2014-05-24 ENCOUNTER — Ambulatory Visit
Admission: RE | Admit: 2014-05-24 | Discharge: 2014-05-24 | Disposition: A | Discharge status: Home / Self Care | Payer: Managed Care, Other (non HMO) | Source: Ambulatory Visit | Attending: Neurology | Admitting: Neurology

## 2014-05-24 ENCOUNTER — Telehealth: Payer: Self-pay | Admitting: Neurology

## 2014-05-24 DIAGNOSIS — R930 Abnormal findings on diagnostic imaging of skull and head, not elsewhere classified: Secondary | ICD-10-CM

## 2014-05-24 DIAGNOSIS — G43009 Migraine without aura, not intractable, without status migrainosus: Secondary | ICD-10-CM

## 2014-05-24 DIAGNOSIS — R292 Abnormal reflex: Secondary | ICD-10-CM

## 2014-05-24 MED ORDER — GADOBENATE DIMEGLUMINE 529 MG/ML IV SOLN
18.0000 mL | Freq: Once | INTRAVENOUS | Status: AC | PRN
Start: 2014-05-24 — End: 2014-05-24
  Administered 2014-05-24: 18 mL via INTRAVENOUS

## 2014-05-24 NOTE — Telephone Encounter (Signed)
I spoke with Vanessa Lamb regarding the MRI results.  The brain looks pretty much unchanged compared to prior scan from April.  The cervical and thoracic spine looks unremarkable.  Because the lesion in the right lenticulostriate is fairly large, I would like to look further and rule out demyelinating disease.  She is agreeable to a lumbar puncture.  We will check CSF for cell count, protein, glucose, gram stain and culture, IgG index, oligoclonal bands, Lyme antibody index and angiotensin converting enzyme.

## 2014-05-28 ENCOUNTER — Other Ambulatory Visit: Payer: Self-pay | Admitting: *Deleted

## 2014-05-28 DIAGNOSIS — G35 Multiple sclerosis: Secondary | ICD-10-CM

## 2014-06-05 ENCOUNTER — Encounter: Payer: Self-pay | Admitting: *Deleted

## 2014-06-06 ENCOUNTER — Other Ambulatory Visit: Payer: Self-pay | Admitting: Neurology

## 2014-06-06 ENCOUNTER — Other Ambulatory Visit (HOSPITAL_COMMUNITY)
Admission: RE | Admit: 2014-06-06 | Discharge: 2014-06-06 | Disposition: A | Discharge status: Home / Self Care | Payer: Managed Care, Other (non HMO) | Source: Ambulatory Visit | Attending: Neurology | Admitting: Neurology

## 2014-06-06 ENCOUNTER — Ambulatory Visit
Admission: RE | Admit: 2014-06-06 | Discharge: 2014-06-06 | Disposition: A | Discharge status: Home / Self Care | Payer: Managed Care, Other (non HMO) | Source: Ambulatory Visit | Attending: Neurology | Admitting: Neurology

## 2014-06-06 DIAGNOSIS — H539 Unspecified visual disturbance: Secondary | ICD-10-CM | POA: Diagnosis not present

## 2014-06-06 DIAGNOSIS — R202 Paresthesia of skin: Secondary | ICD-10-CM | POA: Insufficient documentation

## 2014-06-06 DIAGNOSIS — R51 Headache: Secondary | ICD-10-CM | POA: Diagnosis not present

## 2014-06-06 DIAGNOSIS — G35 Multiple sclerosis: Secondary | ICD-10-CM

## 2014-06-06 LAB — CSF CELL COUNT WITH DIFFERENTIAL
RBC Count, CSF: 1 cu mm — ABNORMAL HIGH
TUBE #: 3
WBC, CSF: 2 cu mm (ref 0–5)

## 2014-06-06 LAB — PROTEIN, CSF: TOTAL PROTEIN, CSF: 39 mg/dL (ref 15–45)

## 2014-06-06 LAB — GLUCOSE, CSF: GLUCOSE CSF: 61 mg/dL (ref 43–76)

## 2014-06-06 NOTE — Discharge Instructions (Signed)

## 2014-06-06 NOTE — Progress Notes (Signed)
One tiger-topped tube of blood drawn for LP labs from right [facility] space; site unremarkable.  jkl

## 2014-06-07 ENCOUNTER — Telehealth: Payer: Self-pay | Admitting: Neurology

## 2014-06-07 ENCOUNTER — Telehealth: Payer: Self-pay | Admitting: *Deleted

## 2014-06-07 NOTE — Telephone Encounter (Signed)
I called patient to let her know LP final lab results are not back

## 2014-06-07 NOTE — Telephone Encounter (Signed)
Pt called f/u on results for her spinal tap she had yesterday 06/06/14 C/B [phone]

## 2014-06-09 LAB — CSF CULTURE: GRAM STAIN: NONE SEEN

## 2014-06-09 LAB — CSF CULTURE W GRAM STAIN
Gram Stain: NONE SEEN
Organism ID, Bacteria: NO GROWTH

## 2014-06-09 LAB — OLIGOCLONAL BANDS, CSF + SERM

## 2014-06-10 ENCOUNTER — Encounter: Payer: Self-pay | Admitting: *Deleted

## 2014-06-10 ENCOUNTER — Telehealth: Payer: Self-pay | Admitting: Neurology

## 2014-06-10 NOTE — Telephone Encounter (Signed)
Pt called wanting to know if pt can have a work note for her to be absent from work due to her pain from the spinal tap she had done on Thursday 06/06/14. C/B [phone]

## 2014-06-10 NOTE — Telephone Encounter (Signed)
Please advise . This is for today's not be able to work

## 2014-06-10 NOTE — Telephone Encounter (Signed)
Ok

## 2014-06-11 LAB — ANAEROBIC CULTURE
Gram Stain: NONE SEEN
Gram Stain: NONE SEEN

## 2014-06-21 ENCOUNTER — Telehealth: Payer: Self-pay | Admitting: Neurology

## 2014-06-21 NOTE — Telephone Encounter (Signed)
All tests of the spinal fluid were unremarkable.  How is she not feeling well?  Is it the headache?  We can increase the amitriptyline to 75mg  daily.Otherwise, we can have her come in for a follow up sooner to discuss.

## 2014-06-21 NOTE — Telephone Encounter (Signed)
[phone] would like the test results of all of her test that she had done

## 2014-06-21 NOTE — Telephone Encounter (Signed)
Patient called stating she is still feeling bad . She is asking if anything else showed up in her  Lumbar Puncture that she is not aware of ?

## 2014-06-24 ENCOUNTER — Telehealth: Payer: Self-pay | Admitting: *Deleted

## 2014-06-24 NOTE — Telephone Encounter (Signed)
Pt is returning your call [phone]

## 2014-06-24 NOTE — Telephone Encounter (Signed)
Left message for patient to return my call.

## 2014-06-24 NOTE — Telephone Encounter (Signed)
Pt called/returning your call at 8:35AM. Pt will call later due to her being out of town.

## 2014-07-05 LAB — FUNGUS CULTURE W SMEAR: SMEAR RESULT: NONE SEEN

## 2014-08-23 ENCOUNTER — Encounter: Payer: Self-pay | Admitting: Neurology

## 2014-08-23 ENCOUNTER — Ambulatory Visit (INDEPENDENT_AMBULATORY_CARE_PROVIDER_SITE_OTHER): Payer: Managed Care, Other (non HMO) | Admitting: Neurology

## 2014-08-23 ENCOUNTER — Other Ambulatory Visit: Payer: Self-pay | Admitting: Neurology

## 2014-08-23 VITALS — BP 98/64 | HR 82 | Resp 20 | Ht 64.0 in | Wt 196.6 lb

## 2014-08-23 DIAGNOSIS — R930 Abnormal findings on diagnostic imaging of skull and head, not elsewhere classified: Secondary | ICD-10-CM

## 2014-08-23 DIAGNOSIS — R9082 White matter disease, unspecified: Secondary | ICD-10-CM

## 2014-08-23 DIAGNOSIS — G43009 Migraine without aura, not intractable, without status migrainosus: Secondary | ICD-10-CM

## 2014-08-23 DIAGNOSIS — G43809 Other migraine, not intractable, without status migrainosus: Secondary | ICD-10-CM

## 2014-08-23 LAB — C-REACTIVE PROTEIN: CRP: <0.5 mg/dL (ref <0.60)

## 2014-08-23 MED ORDER — TOPIRAMATE 25 MG PO TABS: 25.0000 mg | ORAL_TABLET | Freq: Every day | ORAL | Status: DC

## 2014-08-23 NOTE — Patient Instructions (Signed)
1.  We will start topiramate (Topamax) 25mg  at bedtime.  Possible side effects include: impaired thinking, sedation, paresthesias (numbness and tingling) and weight loss.  It may cause dehydration and there is a small risk for kidney stones, so make sure to stay hydrated with water during the day.  There is also a very small risk for glaucoma, so if you notice any change in your vision while taking this medication, see an ophthalmologist.   2.  We will check some more blood work, ANA, ENA, vasculitis panel, B12. 3.  Call in 4 weeks with update.  Follow up in 3 months.

## 2014-08-23 NOTE — Progress Notes (Signed)
NEUROLOGY FOLLOW UP OFFICE NOTE  Vanessa Lamb [id number]  HISTORY OF PRESENT ILLNESS: Vanessa Lamb is a 39 year old left-handed woman with hyperlipidemia, depression, B12 deficiency, and total hysterectomy on hormone replacement who follows up for migraine.  UPDATE: MRI of brain with and without contrast performed on 05/24/14 showed scattered periventricular and subcortical T2 hyperintensities, stable, with large hyperintensity in the right  lenticulostriate.  MRI of the cervical and thoracic spines showed no cord lesions.  She underwent an LP on 06/06/14, which showed a cell count of 2, negative bacterial and fungal cultures, glucose 61, protein 39, negative oligoclonal bands.  There have been no real change with the headaches.  However they are less intense except for one that occurred two weeks ago.  They last all day and occur 2 times a month.  She was previously on amitriptyline , which helped but caused episodes of shortness of breath at night.  She is no longer on a preventative.  She takes Excedrin for the headaches which seem to work except for the one 2 weeks ago.  HISTORY: Onset:  39 years old Location:  Varies, but usually on the top front of her head Quality:  Throbbing, sharp Intensity:  8/10 Aura:  no Prodrome:  no Associated symptoms:  Nausea, photophobia, phonophobia, blurred vision.   Duration:  All day Frequency:  2x/month Triggers/exacerbating factors:  No triggers Relieving factors:  none Activity:  Able to force self to function.  She has right facial weakness and numbness, sometimes not associated with headache.  It can last up to 1-2 days.  It occurs once a week.   She also reports word-finding difficulties and unsteady gait.  Also, her right leg feels shaky when she is walking down stairs.  She had an MRI of the brain without contrast performed on 10/03/13, which showed periventricular and subcortical T2 hyperintensities seen, with some involvement of the  anterior colossal septal margin on the right.  She was evaluated by Dr. Terrace Arabia at [facility] in May.  Labs performed included TSH 0.442, T4 8.2, ANA negative, AChR antibodies negative, B12 263, methylmalonic acid level 15.5, folate 15.5, and homocysteine 8.  An EEG was reportedly normal.  Facial weakness was attributed to migraines.  She had been started on B12 injections.  She felt better initially, but it now seems ineffective.  PAST MEDICAL HISTORY: Past Medical History  Diagnosis Date  . Hyperlipemia   . GERD (gastroesophageal reflux disease)   . IBS (irritable bowel syndrome) TCS SEP 2013    NL COLON/DUO Bx  . B12 deficiency   . Atrophic gastritis EGD SEP 2013  . Headache     MEDICATIONS: Current Outpatient Prescriptions on File Prior to Visit  Medication Sig Dispense Refill  . aspirin-acetaminophen-caffeine (EXCEDRIN MIGRAINE) 250-250-65 MG per tablet Take by mouth every 6 (six) hours as needed for headache.    . pantoprazole (PROTONIX) 40 MG tablet Take 40 mg by mouth every morning.    . simvastatin (ZOCOR) 40 MG tablet Take 40 mg by mouth every evening.    Marland Kitchen amitriptyline (ELAVIL) 50 MG tablet Take 50 mg by mouth at bedtime.    Marland Kitchen ezetimibe (ZETIA) 10 MG tablet Take 10 mg by mouth every evening.    . rizatriptan (MAXALT-MLT) 5 MG disintegrating tablet Take 1 tablet (5 mg total) by mouth as needed for migraine. May repeat in 2 hours if needed (Patient not taking: Reported on 08/23/2014) 15 tablet 12  . venlafaxine XR [facility]  XR) 37.5 MG 24 hr capsule One po qam xone week, then 2 tabs po qam (Patient not taking: Reported on 08/23/2014) 60 capsule 12   No current facility-administered medications on file prior to visit.    ALLERGIES: No Known Allergies  FAMILY HISTORY: Family History  Problem Relation Age of Onset  . Hypertension Mother   . Hypertension Father   . Ulcers Father     PUD  . Colon cancer Maternal Grandfather 23  . Colonic polyp Son 9  .  Diabetes Father   . Thyroid cancer Mother     SOCIAL HISTORY: History   Social History  . Marital Status: Single    Spouse Name: N/A  . Number of Children: 2  . Years of Education: 12   Occupational History  . machine op [employer]   Social History Main Topics  . Smoking status: Current Every Day Smoker -- 1.00 packs/day for 10 years    Types: Cigarettes  . Smokeless tobacco: Never Used     Comment: patient is aware she needs to stop   . Alcohol Use: No  . Drug Use: No  . Sexual Activity: No   Other Topics Concern  . Not on file   Social History Narrative   Patient is single. And  lives at home with her children.    Patient works full time At [employer].   Education high school.   Left handed.   Caffeine soda  Four daily.             REVIEW OF SYSTEMS: Constitutional: fatigue Eyes: No visual changes, double vision, eye pain Ear, nose and throat: No hearing loss, ear pain, nasal congestion, sore throat Cardiovascular: No chest pain, palpitations Respiratory:  No shortness of breath at rest or with exertion, wheezes GastrointestinaI: No nausea, vomiting, diarrhea, abdominal pain, fecal incontinence Genitourinary:  No dysuria, urinary retention or frequency Musculoskeletal:  No neck pain, back pain Integumentary: No rash, pruritus, skin lesions Neurological: as above Psychiatric: No depression, insomnia, anxiety Endocrine: fatigue Hematologic/Lymphatic:  No anemia, purpura, petechiae. Allergic/Immunologic: no itchy/runny eyes, nasal congestion, recent allergic reactions, rashes  PHYSICAL EXAM: Filed Vitals:   08/23/14 1126  BP: 98/64  Pulse: 82  Resp: 20   General: No acute distress Head:  Normocephalic/atraumatic Eyes:  Fundoscopic exam unremarkable without vessel changes, exudates, hemorrhages or papilledema. Neck: supple, no paraspinal tenderness, full range of motion Heart:  Regular rate and rhythm Lungs:  Clear to auscultation  bilaterally Back: No paraspinal tenderness Neurological Exam: alert and oriented to person, place, and time. Attention span and concentration intact, recent and remote memory intact, fund of knowledge intact.  Speech fluent and not dysarthric, language intact.  Right ptosis.  Otherwise, CN II-XII intact. Fundoscopic exam unremarkable without vessel changes, exudates, hemorrhages or papilledema.  Bulk and tone normal, muscle strength 5/5 throughout.  Sensation to light touch, temperature and vibration intact.  Deep tendon reflexes 2+ throughout.  It appears that she has bilateral Babinski responses.  Finger to nose and heel to shin testing intact.  Gait normal, Romberg negative.  IMPRESSION: Chronic migraine, atypical and presenting with right facial weakness Abnormal white matter on MRI of brain.  Work up has been unremarkable.  It is nonspecific and could be related to migraine, however the lesions seem more pronounced than would be expected in a migrainer.  PLAN: 1.  Will start topamax 25mg  at bedtime.  Side effects discussed.  Call in 4 weeks with update 2.  Excedrin as  needed.  Would not give triptan given the focal facial droop symptom. 3.  I don't have the CSF Lyme and ACE levels back.  Will check for those results. 4.  Check serum B12, ANA, Sed Rate, vasculitis panel 5.  Follow up in 3 months.  Shon Millet, DO  CC:  Elfredia Nevins, MD

## 2014-08-24 LAB — VITAMIN B12: Vitamin B-12: 270 pg/mL (ref 211–911)

## 2014-08-24 LAB — C3 AND C4
C3 Complement: 164 mg/dL (ref 90–180)
C4 COMPLEMENT: 30 mg/dL (ref 10–40)

## 2014-08-24 LAB — SEDIMENTATION RATE: Sed Rate: 6 mm/hr (ref 0–20)

## 2014-08-26 LAB — ENA 9 PANEL
Centromere Ab Screen: <1
DS DNA AB: 3 [IU]/mL
ENA SM Ab Ser-aCnc: <1
JO-1 ANTIBODY, IGG: NEGATIVE
Ribosomal P Protein Ab: <1
SCLERODERMA (SCL-70) (ENA) ANTIBODY, IGG: NEGATIVE
SM/RNP: <1
SSA (Ro) (ENA) Antibody, IgG: <1
SSB (La) (ENA) Antibody, IgG: <1

## 2014-08-26 LAB — ANCA SCREEN W REFLEX TITER
ATYPICAL P-ANCA SCREEN: NEGATIVE
P-ANCA SCREEN: NEGATIVE
c-ANCA Screen: NEGATIVE

## 2014-08-26 LAB — SJOGRENS SYNDROME-B EXTRACTABLE NUCLEAR ANTIBODY: SSB (La) (ENA) Antibody, IgG: <1

## 2014-08-26 LAB — SJOGRENS SYNDROME-A EXTRACTABLE NUCLEAR ANTIBODY: SSA (Ro) (ENA) Antibody, IgG: <1

## 2014-08-27 LAB — ANTIPHOSPHOLOPID AB PANEL
ANTICARDIOLIPIN IGG: 3 GPL U/mL (ref <23)
Anticardiolipin IgA: 2 APL U/mL (ref <22)
Anticardiolipin IgM: 0 MPL U/mL (ref <11)
BETA 2 GLYCO I IGG: 7 G Units (ref <20)
BETA-2-GLYCOPROTEIN I IGA: 3 A Units (ref <20)
BETA-2-GLYCOPROTEIN I IGM: 6 M Units (ref <20)
PHOSPHATYDALSERINE, IGA: 5 U/mL (ref <20)
Phosphatidylserine IgG Autoantibodies: 6 U/mL (ref <16)
Phosphatydalserine, IgM: 7 U/mL (ref <22)

## 2014-08-28 ENCOUNTER — Telehealth: Payer: Self-pay | Admitting: *Deleted

## 2014-08-28 LAB — ANA: Anti Nuclear Antibody(ANA): NEGATIVE

## 2014-08-28 NOTE — Telephone Encounter (Signed)
Patient is aware of normal lab results with the exception of the B12  I advised her to discuss with her PCP

## 2014-08-28 NOTE — Telephone Encounter (Signed)
-----   Message from Cira Servant, DO sent at 08/28/2014 11:44 AM EST ----- The blood work looks unremarkable.  However, the b12 is only minimally improved from prior lab from 9 months ago (now 270 from 263)  ----- Message -----    From: Lab in [facility] Interface    Sent: 08/28/2014   8:16 AM      To: Cira Servant, DO

## 2014-09-23 ENCOUNTER — Telehealth: Payer: Self-pay | Admitting: Neurology

## 2014-09-23 ENCOUNTER — Other Ambulatory Visit: Payer: Self-pay | Admitting: *Deleted

## 2014-09-23 DIAGNOSIS — G43009 Migraine without aura, not intractable, without status migrainosus: Secondary | ICD-10-CM

## 2014-09-23 MED ORDER — TOPIRAMATE 25 MG PO TABS: 25.0000 mg | ORAL_TABLET | Freq: Every day | ORAL | Status: DC

## 2014-09-23 NOTE — Telephone Encounter (Signed)
Pt called wanting to give an update on the script for TOPAMAX 25mg . She stated that it is working for her and would like the script to be called in to the pharmacy. Pharmacy: [facility] IN [place] C/B [phone]

## 2014-09-23 NOTE — Telephone Encounter (Signed)
Rx was sent to pharmacy for TOPAMAX 25 mg 1 po qd #30 with 3 refills

## 2014-11-29 ENCOUNTER — Ambulatory Visit: Payer: Managed Care, Other (non HMO) | Admitting: Neurology

## 2014-12-30 ENCOUNTER — Ambulatory Visit: Payer: Managed Care, Other (non HMO) | Admitting: Neurology

## 2015-01-03 ENCOUNTER — Other Ambulatory Visit (HOSPITAL_COMMUNITY): Payer: Self-pay | Admitting: Internal Medicine

## 2015-01-03 DIAGNOSIS — Z1231 Encounter for screening mammogram for malignant neoplasm of breast: Secondary | ICD-10-CM

## 2015-01-08 ENCOUNTER — Ambulatory Visit (HOSPITAL_COMMUNITY)
Admission: RE | Admit: 2015-01-08 | Discharge: 2015-01-08 | Disposition: A | Discharge status: Home / Self Care | Payer: Managed Care, Other (non HMO) | Source: Ambulatory Visit | Attending: Internal Medicine | Admitting: Internal Medicine

## 2015-01-08 DIAGNOSIS — Z1231 Encounter for screening mammogram for malignant neoplasm of breast: Secondary | ICD-10-CM

## 2015-01-16 ENCOUNTER — Other Ambulatory Visit: Payer: Self-pay | Admitting: Neurology

## 2015-03-14 ENCOUNTER — Ambulatory Visit (INDEPENDENT_AMBULATORY_CARE_PROVIDER_SITE_OTHER): Payer: Managed Care, Other (non HMO) | Admitting: Neurology

## 2015-03-14 ENCOUNTER — Institutional Professional Consult (permissible substitution): Payer: Managed Care, Other (non HMO) | Admitting: Neurology

## 2015-03-14 ENCOUNTER — Encounter: Payer: Self-pay | Admitting: Neurology

## 2015-03-14 VITALS — BP 116/72 | HR 79 | Temp 98.1°F | Resp 16 | Ht 64.0 in | Wt 192.7 lb

## 2015-03-14 DIAGNOSIS — R930 Abnormal findings on diagnostic imaging of skull and head, not elsewhere classified: Secondary | ICD-10-CM

## 2015-03-14 DIAGNOSIS — G4730 Sleep apnea, unspecified: Secondary | ICD-10-CM | POA: Diagnosis not present

## 2015-03-14 DIAGNOSIS — F17200 Nicotine dependence, unspecified, uncomplicated: Secondary | ICD-10-CM

## 2015-03-14 DIAGNOSIS — R9082 White matter disease, unspecified: Secondary | ICD-10-CM | POA: Insufficient documentation

## 2015-03-14 DIAGNOSIS — E538 Deficiency of other specified B group vitamins: Secondary | ICD-10-CM

## 2015-03-14 DIAGNOSIS — G43809 Other migraine, not intractable, without status migrainosus: Secondary | ICD-10-CM | POA: Diagnosis not present

## 2015-03-14 DIAGNOSIS — G43009 Migraine without aura, not intractable, without status migrainosus: Secondary | ICD-10-CM

## 2015-03-14 MED ORDER — TOPIRAMATE 50 MG PO TABS: 50.0000 mg | ORAL_TABLET | Freq: Every day | ORAL | Status: DC

## 2015-03-14 NOTE — Patient Instructions (Signed)
Increase topiramate to 50mg  at bedtime.  Call in 4 weeks with update We will refer you to evaluate for sleep apnea We will refer you to [facility] at the [facility] for consultation to see if we are missing anything Follow up in 3 months or after visit at [facility]

## 2015-03-14 NOTE — Progress Notes (Signed)
NEUROLOGY FOLLOW UP OFFICE NOTE  [name] [id number]  HISTORY OF PRESENT ILLNESS: Vanessa Lamb is a 39 year old left-handed woman with hyperlipidemia, smoker, depression, B12 deficiency, and total hysterectomy on hormone replacement who follows up for migraine and abnormal white matter on brain MRI.  Labs reviewed.  She is accompanied by her uncle who provides some history.  UPDATE: She was doing well until 2 months ago when she began having increase in headaches and associated symptoms such as the right sided facial droop.  They occur about once a week and can last 1 to 2 days.  She thinks the symptoms may occur outside of the headaches as well.  She also reports a new symptom.  She developed a cold sensation on the right foot and feeling that her right thigh is wet.  That lasted for several days and she never had this symptom before.  She also feel more clumsy all the time.  She will sometimes trip.  She notes problems with sleep.  She wakes up gasping for breath.  Her daughter says that she snores and will sometimes have apnea in her sleep. Intensity:  8/10 Duration:  1 to 2 days Frequency:  Once a week Current abortive medication:  Excedrin Migraine (helps) Current preventative medication:  topiramate   Labs from February included normal Sjogren's, ANCA, Scl-70 antibody, C3, C4, antiphospholipid antibodies, C3, C4, Sed Rate 6, ANA negative.  B12 was 270.  She was started on injections but recent B12 level was still reportedly low.  HISTORY: Onset:  39 years old Location:  Varies, but usually on the top front of her head Quality:  Throbbing, sharp Initial Intensity:  8/10 Aura:  no Prodrome:  no Associated symptoms:  Nausea, photophobia, phonophobia, blurred vision.    Initial Duration:  All day Initial Frequency:  2x/month Triggers/exacerbating factors:  No triggers Relieving factors:  none Activity:  Able to force self to function.  Past abortive medication:   Maxalt Past preventative medication:  amitriptyline  (caused shortness of breath)  She has right facial weakness and numbness, sometimes not associated with headache.  It can last up to 1-2 days.  It occurs once a week.   She also reports word-finding difficulties and unsteady gait.  Also, her right leg feels shaky when she is walking down stairs.  She had an MRI of the brain without contrast performed on 10/03/13, which showed periventricular and subcortical T2 hyperintensities seen, with some involvement of the anterior colossal septal margin on the right.  Repeat MRI of brain with and without contrast performed on 05/24/14 showed scattered periventricular and subcortical T2 hyperintensities, stable, with large hyperintensity in the right  lenticulostriate.  MRI of the cervical and thoracic spines showed no cord lesions.    Labs performed included TSH 0.442, T4 8.2, ANA negative, AChR antibodies negative, B12 263, methylmalonic acid level 15.5, folate 15.5, and homocysteine 8.  An EEG was reportedly normal.  Facial weakness was attributed to migraines.  She had been started on B12 injections.  She felt better initially, but it now seems ineffective.  She underwent an LP on 06/06/14, which showed a cell count of 2, negative bacterial and fungal cultures, glucose 61, protein 39, negative oligoclonal bands.  PAST MEDICAL HISTORY: Past Medical History  Diagnosis Date  . Hyperlipemia   . GERD (gastroesophageal reflux disease)   . IBS (irritable bowel syndrome) TCS SEP 2013    NL COLON/DUO Bx  . B12 deficiency   . Atrophic  gastritis EGD SEP 2013  . Headache     MEDICATIONS: Current Outpatient Prescriptions on File Prior to Visit  Medication Sig Dispense Refill  . aspirin-acetaminophen-caffeine (EXCEDRIN MIGRAINE) 250-250-65 MG per tablet Take by mouth every 6 (six) hours as needed for headache.    . pantoprazole (PROTONIX) 40 MG tablet Take 40 mg by mouth every morning.    . rizatriptan  (MAXALT-MLT) 5 MG disintegrating tablet Take 1 tablet (5 mg total) by mouth as needed for migraine. May repeat in 2 hours if needed 15 tablet 12  . simvastatin (ZOCOR) 40 MG tablet Take 40 mg by mouth every evening.    Marland Kitchen amitriptyline (ELAVIL) 50 MG tablet Take 50 mg by mouth at bedtime.     No current facility-administered medications on file prior to visit.    ALLERGIES: No Known Allergies  FAMILY HISTORY: Family History  Problem Relation Age of Onset  . Hypertension Mother   . Hypertension Father   . Ulcers Father     PUD  . Colon cancer Maternal Grandfather 76  . Colonic polyp Son 9  . Diabetes Father   . Thyroid cancer Mother     SOCIAL HISTORY: Social History   Social History  . Marital Status: Single    Spouse Name: N/A  . Number of Children: 2  . Years of Education: 12   Occupational History  . machine op [employer]   Social History Main Topics  . Smoking status: Current Every Day Smoker -- 1.00 packs/day for 10 years    Types: Cigarettes  . Smokeless tobacco: Never Used     Comment: patient is aware she needs to stop   . Alcohol Use: No  . Drug Use: No  . Sexual Activity: No   Other Topics Concern  . Not on file   Social History Narrative   Patient is single. And  lives at home with her children.    Patient works full time At [employer].   Education high school.   Left handed.   Caffeine soda  Four daily.             REVIEW OF SYSTEMS: Constitutional: No fevers, chills, or sweats, no generalized fatigue, change in appetite Eyes: No visual changes, double vision, eye pain Ear, nose and throat: No hearing loss, ear pain, nasal congestion, sore throat Cardiovascular: No chest pain, palpitations Respiratory:  No shortness of breath at rest or with exertion, wheezes GastrointestinaI: No nausea, vomiting, diarrhea, abdominal pain, fecal incontinence Genitourinary:  No dysuria, urinary retention or frequency Musculoskeletal:  No neck  pain, back pain Integumentary: No rash, pruritus, skin lesions Neurological: as above Psychiatric: No depression, insomnia, anxiety Endocrine: No palpitations, fatigue, diaphoresis, mood swings, change in appetite, change in weight, increased thirst Hematologic/Lymphatic:  No anemia, purpura, petechiae. Allergic/Immunologic: no itchy/runny eyes, nasal congestion, recent allergic reactions, rashes  PHYSICAL EXAM: Filed Vitals:   03/14/15 0756  BP: 116/72  Pulse: 79  Temp: 98.1 F (36.7 C)  Resp: 16   General: No acute distress.  Patient appears well-groomed.   Head:  Normocephalic/atraumatic Eyes:  Fundoscopic exam unremarkable without vessel changes, exudates, hemorrhages or papilledema. Neck: supple, no paraspinal tenderness, full range of motion Heart:  Regular rate and rhythm Lungs:  Clear to auscultation bilaterally Back: No paraspinal tenderness Neurological Exam: alert and oriented to person, place, and time. Attention span and concentration intact, recent and remote memory intact, fund of knowledge intact.  Speech fluent and not dysarthric, language intact.  Right  ptosis.  Otherwise, CN II-XII intact. Fundoscopic exam unremarkable without vessel changes, exudates, hemorrhages or papilledema.  Bulk and tone normal, muscle strength 5/5 throughout.  Sensation to light touch, temperature and vibration intact.  Deep tendon reflexes 2+ throughout.  It appears that she has bilateral Babinski responses.  Finger to nose and heel to shin testing intact.  Gait normal, Romberg negative.  IMPRESSION: 1.  Migraine, atypical and presenting with right facial weakness 2.  Abnormal white matter on MRI of brain.  Work up has been unremarkable.  It is nonspecific and could be related to migraine or smoking history, however the lesions seem more pronounced than would be expected in a migrainer.  Workup for demyelinating disease, vascultis or other cause for abnormal white matter in the brain has been  unrevealing.  However, given her symptoms, as well as new symptoms, I would like her evaluated by the MS clinic at [facility] to make sure we are not missing a neuro-autoimmune disorder. 3.  Apnea during sleep.  She may have OSA, which can affect headaches. 4.  B12 deficiency 5.  Tobacco abuse  PLAN: 1.  Refer to MS Clinic at [facility] to evaluate for a neuro-autoimmune disease 2.  Refer to [facility] to evaluate for OSA 3.  I still never received CSF results of ACE and Lyme.  Will contact the lab to get this. 4.  Increase topiramate to 50mg  at bedtime 5.  Continue B12 injections 6.  Smoking cessation 7.  Follow up in 3 months or after consults  Shon Millet, DO  CC:  Elfredia Nevins, MD

## 2015-03-18 ENCOUNTER — Encounter (HOSPITAL_COMMUNITY): Payer: Self-pay | Admitting: Cardiology

## 2015-03-18 ENCOUNTER — Emergency Department (HOSPITAL_COMMUNITY)
Admission: EM | Admit: 2015-03-18 | Discharge: 2015-03-18 | Disposition: A | Discharge status: Home / Self Care | Payer: Managed Care, Other (non HMO) | Attending: Emergency Medicine | Admitting: Emergency Medicine

## 2015-03-18 ENCOUNTER — Emergency Department (HOSPITAL_COMMUNITY): Payer: Managed Care, Other (non HMO)

## 2015-03-18 ENCOUNTER — Telehealth: Payer: Self-pay

## 2015-03-18 DIAGNOSIS — K219 Gastro-esophageal reflux disease without esophagitis: Secondary | ICD-10-CM | POA: Insufficient documentation

## 2015-03-18 DIAGNOSIS — Z720 Tobacco use: Secondary | ICD-10-CM | POA: Insufficient documentation

## 2015-03-18 DIAGNOSIS — E785 Hyperlipidemia, unspecified: Secondary | ICD-10-CM | POA: Insufficient documentation

## 2015-03-18 DIAGNOSIS — E538 Deficiency of other specified B group vitamins: Secondary | ICD-10-CM | POA: Insufficient documentation

## 2015-03-18 DIAGNOSIS — R531 Weakness: Secondary | ICD-10-CM | POA: Diagnosis present

## 2015-03-18 DIAGNOSIS — R29810 Facial weakness: Secondary | ICD-10-CM | POA: Diagnosis not present

## 2015-03-18 HISTORY — DX: Interstitial cystitis (chronic) without hematuria: N30.10

## 2015-03-18 LAB — COMPREHENSIVE METABOLIC PANEL
ALBUMIN: 4.5 g/dL (ref 3.5–5.0)
ALK PHOS: 89 U/L (ref 38–126)
ALT: 17 U/L (ref 14–54)
ANION GAP: 9 (ref 5–15)
AST: 21 U/L (ref 15–41)
BUN: 6 mg/dL (ref 6–20)
CALCIUM: 9.2 mg/dL (ref 8.9–10.3)
CO2: 24 mmol/L (ref 22–32)
Chloride: 108 mmol/L (ref 101–111)
Creatinine, Ser: 0.66 mg/dL (ref 0.44–1.00)
GFR calc Af Amer: >60 mL/min (ref >60)
GFR calc non Af Amer: >60 mL/min (ref >60)
GLUCOSE: 106 mg/dL — AB (ref 65–99)
POTASSIUM: 3.5 mmol/L (ref 3.5–5.1)
SODIUM: 141 mmol/L (ref 135–145)
Total Bilirubin: 0.5 mg/dL (ref 0.3–1.2)
Total Protein: 7.7 g/dL (ref 6.5–8.1)

## 2015-03-18 LAB — DIFFERENTIAL
BASOS ABS: 0 10*3/uL (ref 0.0–0.1)
Basophils Relative: 0 % (ref 0–1)
EOS PCT: 1 % (ref 0–5)
Eosinophils Absolute: 0.1 10*3/uL (ref 0.0–0.7)
LYMPHS ABS: 3.7 10*3/uL (ref 0.7–4.0)
LYMPHS PCT: 27 % (ref 12–46)
Monocytes Absolute: 0.5 10*3/uL (ref 0.1–1.0)
Monocytes Relative: 4 % (ref 3–12)
NEUTROS PCT: 68 % (ref 43–77)
Neutro Abs: 9.7 10*3/uL — ABNORMAL HIGH (ref 1.7–7.7)

## 2015-03-18 LAB — CBC
HCT: 43.1 % (ref 36.0–46.0)
HEMOGLOBIN: 14.8 g/dL (ref 12.0–15.0)
MCH: 32.3 pg (ref 26.0–34.0)
MCHC: 34.3 g/dL (ref 30.0–36.0)
MCV: 94.1 fL (ref 78.0–100.0)
PLATELETS: 248 10*3/uL (ref 150–400)
RBC: 4.58 MIL/uL (ref 3.87–5.11)
RDW: 12.7 % (ref 11.5–15.5)
WBC: 14.1 10*3/uL — AB (ref 4.0–10.5)

## 2015-03-18 NOTE — ED Notes (Signed)
Woke up during the night with burning in the right arm.  Then woke up this morning with weakness in the right arm.  States she has been having some symptoms and seeing a neurologist.  ? Work up for [employer].

## 2015-03-18 NOTE — ED Provider Notes (Signed)
CSN: [id number]     Arrival date & time 03/18/15  4098 History  This chart was scribed for Azalia Bilis, MD by Murriel Hopper, ED Scribe. This patient was seen in room APA01/APA01 and the patient's care was started at 10:34 AM.    Chief Complaint  Patient presents with  . Weakness      The history is provided by the patient. No language interpreter was used.    HPI Comments: Vanessa Lamb is a 39 y.o. female who presents to the Emergency Department complaining of intermittent weakness that has been present for about a year. Pt states she woke up in the middle of the night last night and had a burning sensation in her right arm, and then woke up this morning and noticed that the right side of her face was drooping. Pt states she felt fine when she went to bed. Pt states she has been seeing a neurologist for ongoing problems over the past year or so, and has an appointment to see a neurologist at [facility] in November. Pt was suspected of having a stroke in 2015, and received an MRI about a year ago for similar symptoms.   Past Medical History  Diagnosis Date  . Hyperlipemia   . GERD (gastroesophageal reflux disease)   . IBS (irritable bowel syndrome) TCS SEP 2013    NL COLON/DUO Bx  . B12 deficiency   . Atrophic gastritis EGD SEP 2013  . Headache   . Interstitial cystitis    Past Surgical History  Procedure Laterality Date  . Abdominal hysterectomy  2005    complete  . Tubal ligation    . Wisdom tooth extraction    . Colonoscopy  03/15/2012    Procedure: COLONOSCOPY;  Surgeon: West Bali, MD;  [facility]: [facility];  Service: Endoscopy;  Laterality: N/A;  10:30 AM  . Esophagogastroduodenoscopy  03/15/2012    Procedure: ESOPHAGOGASTRODUODENOSCOPY (EGD);  Surgeon: West Bali, MD;  Location: [facility];  Service: Endoscopy;  Laterality: N/A;   Family History  Problem Relation Age of Onset  . Hypertension Mother   . Hypertension Father   . Ulcers Father     PUD  .  Colon cancer Maternal Grandfather 49  . Colonic polyp Son 9  . Diabetes Father   . Thyroid cancer Mother    Social History  Substance Use Topics  . Smoking status: Current Every Day Smoker -- 1.00 packs/day for 10 years    Types: Cigarettes  . Smokeless tobacco: Never Used     Comment: patient is aware she needs to stop   . Alcohol Use: No   OB History    No data available     Review of Systems  A complete 10 system review of systems was obtained and all systems are negative except as noted in the HPI and PMH.    Allergies  Review of patient's allergies indicates no known allergies.  Home Medications   Prior to Admission medications   Medication Sig Start Date End Date Taking? Authorizing Provider  aspirin-acetaminophen-caffeine (EXCEDRIN MIGRAINE) [phone] MG per tablet Take by mouth every 6 (six) hours as needed for headache.   Yes Historical Provider, MD  Cyanocobalamin (VITAMIN B-12 IJ) Inject 1 Dose as directed every 14 (fourteen) days.   Yes Historical Provider, MD  pantoprazole (PROTONIX) 40 MG tablet Take 40 mg by mouth every morning.   Yes Historical Provider, MD  rizatriptan (MAXALT-MLT) 5 MG disintegrating tablet Take 1 tablet (  5 mg total) by mouth as needed for migraine. May repeat in 2 hours if needed 11/02/13  Yes Levert Feinstein, MD  simvastatin (ZOCOR) 40 MG tablet Take 40 mg by mouth every evening.   Yes Historical Provider, MD  topiramate (TOPAMAX) 50 MG tablet Take 1 tablet (50 mg total) by mouth at bedtime. 03/14/15  Yes Adam R Jaffe, DO   BP 134/95 mmHg  Pulse 76  Temp(Src) 98.1 F (36.7 C) (Oral)  Resp 16  Ht 5\' 4"  (1.626 m)  Wt 192 lb (87.091 kg)  BMI 32.94 kg/m2  SpO2 100% Physical Exam  Constitutional: She is oriented to person, place, and time. She appears well-developed and well-nourished.  HENT:  Head: Normocephalic and atraumatic.  Eyes: Pupils are equal, round, and reactive to light.  Cardiovascular: Regular rhythm.   Pulmonary/Chest: Effort  normal.  Abdominal: Soft.  Musculoskeletal: Normal range of motion.  Neurological: She is alert and oriented to person, place, and time.  5/5 strength in major muscle groups of  bilateral upper and lower extremities. Speech normal.   Mild right-sided facial droop  Nursing note and vitals reviewed.   ED Course  Procedures (including critical care time)  DIAGNOSTIC STUDIES: Oxygen Saturation is 100% on room air, normal by my interpretation.    COORDINATION OF CARE: 10:41 AM Discussed treatment plan with pt at bedside and pt agreed to plan.   Labs Review Labs Reviewed  CBC - Abnormal; Notable for the following:    WBC 14.1 (*)    All other components within normal limits  DIFFERENTIAL - Abnormal; Notable for the following:    Neutro Abs 9.7 (*)    All other components within normal limits  COMPREHENSIVE METABOLIC PANEL - Abnormal; Notable for the following:    Glucose, Bld 106 (*)    All other components within normal limits    Imaging Review Mr Brain Wo Contrast  03/18/2015   CLINICAL DATA:  39 year old female with hyperlipidemia and history of smoking presenting with right-sided facial droop off and on for the past 5 years. Migraines. Subsequent encounter.  EXAM: MRI HEAD WITHOUT CONTRAST  TECHNIQUE: Multiplanar, multiecho pulse sequences of the brain and surrounding structures were obtained without intravenous contrast.  COMPARISON:  05/24/2014 and 10/03/2013 brain MR.  FINDINGS: Exam is slightly motion degraded.  No acute infarct.  Region of altered signal intensity involving right caudate head and anterior aspect of the right lenticular nucleus unchanged and may reflect result of prior small infarct. There are a few scattered punctate and patchy white matter type changes similar to prior exam. These may reflect result of small vessel disease in this smoker with hyperlipidemia. Other causes of white matter type changes such as that secondary to migraine headaches, demyelinating  process, vasculitis or inflammatory process cannot be excluded.  No hydrocephalus.  No intracranial hemorrhage.  No intracranial mass lesion noted on this unenhanced exam.  Major intracranial vascular structures are patent.  Small pituitary gland unchanged. Cervical medullary junction, pineal region and orbital structures unremarkable.  IMPRESSION: No acute infarct.  Region of altered signal intensity involving right caudate head and anterior aspect of the right lenticular nucleus unchanged and may reflect result of prior small infarct. There are a few scattered punctate and patchy white matter type changes similar to prior exam. These may reflect result of small vessel disease in this smoker with hyperlipidemia. Other causes of white matter type changes such as that secondary to migraine headaches, demyelinating process, vasculitis or inflammatory process cannot be  excluded.   Electronically Signed   By: Lacy Duverney M.D.   On: 03/18/2015 13:11   I have personally reviewed and evaluated these images and lab results as part of my medical decision-making.   EKG Interpretation None      MDM   Final diagnoses:  None    MRIs without acute pathology.  Differential diagnosis remains the same.  She is scheduled to see neurology at [facility].  This will be beneficial.  Referral back to her own neurologist in [place].  I personally performed the services described in this documentation, which was scribed in my presence. The recorded information has been reviewed and is accurate.      Azalia Bilis, MD 03/18/15 1328

## 2015-03-18 NOTE — ED Notes (Signed)
Resting quietly.  Easily arrouses.  No change in condition.

## 2015-03-18 NOTE — Telephone Encounter (Signed)
Patient called in this morning. She stated that she woke up feeling different and noticed a facial drop. She states that she went to work and had to leave because she could not pick up anything over her head and her body started feeling weak on right side. Patient stated that she went to her primary doctor " they took one look at me and told me I need to go to the ER because I was having a stroke." Patient states that she does not feel like she is having a stroke, and would like Dr. Moises Blood opinion. She states that she has also been leaning to the right side and knee buckles when she is trying to walk. I reviewed this information with Dr. Everlena Cooper and he advised that she go directly to the ER because of difference in presentation of symptoms. Patient was called back and advised. She states that she was on her way to the ER.

## 2015-03-18 NOTE — ED Notes (Signed)
neurologically no changes in pt condition.

## 2015-03-19 ENCOUNTER — Telehealth: Payer: Self-pay | Admitting: Neurology

## 2015-03-19 NOTE — Telephone Encounter (Signed)
Dr. Everlena Cooper if you will review her MRI she had done at the ER yesterday. She was on the one we advised to go to ER due to worsen different symptoms. [facility]

## 2015-03-19 NOTE — Telephone Encounter (Signed)
Spoke with pt. Pt. Advised of Dr. Moises Blood opinion and will cb with any questions or concerns. [facility]

## 2015-03-19 NOTE — Telephone Encounter (Signed)
[email or web address] mah 

## 2015-03-19 NOTE — Telephone Encounter (Signed)
Pt called requesting that Dr. Everlena Cooper review her most recent MRI from 03/18/15  And does he need to see her for another follow up appt/call back @ [phone]

## 2015-03-19 NOTE — Telephone Encounter (Signed)
It looks unchanged compared to prior MRI.  There is nothing new.  At this point, I assume symptoms are related to migraine, but I would like to see what their consensus is when she is seen at [facility].

## 2015-03-19 NOTE — Telephone Encounter (Signed)
Jaffe patient.  

## 2015-04-11 ENCOUNTER — Ambulatory Visit: Payer: Managed Care, Other (non HMO) | Admitting: Neurology

## 2015-05-09 ENCOUNTER — Telehealth: Payer: Self-pay | Admitting: Neurology

## 2015-05-09 NOTE — Telephone Encounter (Signed)
Pt called to request a copy of MRI/Images//call back @ [phone]

## 2015-05-09 NOTE — Telephone Encounter (Signed)
Spoke to pt. Requested Records faxed to [facility] for upcoming appointment.

## 2015-06-03 ENCOUNTER — Institutional Professional Consult (permissible substitution): Payer: Managed Care, Other (non HMO) | Admitting: Pulmonary Disease

## 2015-06-19 ENCOUNTER — Other Ambulatory Visit: Payer: Self-pay | Admitting: Neurology

## 2015-06-19 NOTE — Telephone Encounter (Signed)
Last OV: 03/16/15 Next OV:  N/A, to schedule after 2nd opinion at [facility].

## 2015-06-20 ENCOUNTER — Ambulatory Visit: Payer: Managed Care, Other (non HMO) | Admitting: Neurology

## 2015-08-18 ENCOUNTER — Encounter: Payer: Self-pay | Admitting: Neurology

## 2015-08-18 ENCOUNTER — Ambulatory Visit (INDEPENDENT_AMBULATORY_CARE_PROVIDER_SITE_OTHER): Payer: 59 | Admitting: Neurology

## 2015-08-18 VITALS — BP 102/65 | HR 77 | Resp 16 | Ht 64.0 in | Wt 194.0 lb

## 2015-08-18 DIAGNOSIS — R0683 Snoring: Secondary | ICD-10-CM | POA: Diagnosis not present

## 2015-08-18 DIAGNOSIS — R0681 Apnea, not elsewhere classified: Secondary | ICD-10-CM | POA: Diagnosis not present

## 2015-08-18 DIAGNOSIS — R51 Headache: Secondary | ICD-10-CM

## 2015-08-18 DIAGNOSIS — G4719 Other hypersomnia: Secondary | ICD-10-CM

## 2015-08-18 DIAGNOSIS — R519 Headache, unspecified: Secondary | ICD-10-CM

## 2015-08-18 DIAGNOSIS — F17200 Nicotine dependence, unspecified, uncomplicated: Secondary | ICD-10-CM

## 2015-08-18 DIAGNOSIS — Z720 Tobacco use: Secondary | ICD-10-CM | POA: Diagnosis not present

## 2015-08-18 NOTE — Patient Instructions (Signed)

## 2015-08-18 NOTE — Progress Notes (Signed)
Subjective:    Patient ID: Vanessa Lamb is a 40 y.o. female.  HPI     Huston Foley, MD, PhD [facility] [address], [address] [address] [place], [place] [address]  Dear Dr. Sherwood Gambler,   I saw your patient, Vanessa Lamb, upon your kind request, in my neurologic clinic today for initial consultation of her sleep disorder, in particular, concern for underlying obstructive sleep apnea. The patient is accompanied by her uncle today. As you know, [name] is a 40 year old right-handed woman with an underlying medical history of hyperlipidemia, reflux disease, IBS, B12 deficiency, migraine headaches (for which she is followed by Dr. Everlena Cooper at [facility] neurology, she also sees a neurologist at [facility]?), and obesity, who reports snoring and excessive daytime somnolence. Her bedtime is around 9:30 or 10 PM and rise time is 4:45 AM to 5 AM. She does not typically wake up rested. Her Epworth sleepiness score is 8 out of 24 today, her fatigue score is 59 out of 63. She often wakes up with a headache, this happens about 3 times a week. She describes this as a dull generalized headache, different from her typical migraines. She denies nocturia. She denies restless leg symptoms. 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 She has gained weight in the past few years. She lives at home with her 2 children, ages 50 and 73. Her 26 year old daughter sleeps in her bed with her and reports snoring, snorting sounds, gurgling sounds and apneic pauses while patient is asleep. She has no family history of OSA. She smokes one pack per day, does not drink any alcohol or use illicit drugs. She drinks quite a bit of caffeine in the form of sodas, about 6 cans per day, but does not always finish her can she states. I reviewed your office note from 07/01/2015, which you kindly included. She had an overnight pulse oximetry test through your office which I reviewed: This was from  10/10/2013: Total valid test time was 8 hours and 18 minutes, average oxygen saturation 95.3%, nadir of 79%. Time below 90% saturation was 2 minutes and 40 seconds, time below 89% saturation was 2 minutes and 32 seconds.  Her Past Medical History Is Significant For: Past Medical History  Diagnosis Date  . Hyperlipemia   . GERD (gastroesophageal reflux disease)   . IBS (irritable bowel syndrome) TCS SEP 2013    NL COLON/DUO Bx  . B12 deficiency   . Atrophic gastritis EGD SEP 2013  . Headache   . Interstitial cystitis     Her Past Surgical History Is Significant For: Past Surgical History  Procedure Laterality Date  . Abdominal hysterectomy  2005    complete  . Tubal ligation    . Wisdom tooth extraction    . Colonoscopy  03/15/2012    Procedure: COLONOSCOPY;  Surgeon: West Bali, MD;  [facility];  Service: Endoscopy;  Laterality: N/A;  10:30 AM  . Esophagogastroduodenoscopy  03/15/2012    Procedure: ESOPHAGOGASTRODUODENOSCOPY (EGD);  Surgeon: West Bali, MD;  [facility];  Service: Endoscopy;  Laterality: N/A;  . Tonsillectomy      Her Family History Is Significant For: Family History  Problem Relation Age of Onset  . Hypertension Mother   . Hypertension Father   . Ulcers Father     PUD  . Colon cancer Maternal Grandfather 64  . Colonic polyp Son 9  . Diabetes Father   .  Thyroid cancer Mother     Her Social History Is Significant For: Social History   Social History  . Marital Status: Single    Spouse Name: N/A  . Number of Children: 2  . Years of Education: 12   Occupational History  . machine op [employer]   Social History Main Topics  . Smoking status: Current Every Day Smoker -- 1.00 packs/day for 10 years    Types: Cigarettes  . Smokeless tobacco: Never Used     Comment: patient is aware she needs to stop   . Alcohol Use: No  . Drug Use: No  . Sexual Activity: No   Other Topics Concern  . None   Social  History Narrative   Patient is single. And  lives at home with her children.    Patient works full time At [employer].   Education high school.   Left handed.   Caffeine 6 drinks daily.             Her Allergies Are:  No Known Allergies:   Her Current Medications Are:  Outpatient Encounter Prescriptions as of 08/18/2015  Medication Sig  . cyanocobalamin (,VITAMIN B-12,) 1000 MCG/ML injection Inject into the muscle.  . pantoprazole (PROTONIX) 40 MG tablet Take 40 mg by mouth every morning.  . simvastatin (ZOCOR) 40 MG tablet Take 40 mg by mouth every evening.  . topiramate (TOPAMAX) 50 MG tablet TAKE 1 TABLET BY MOUTH AT BEDTIME.  . [DISCONTINUED] aspirin 81 MG EC tablet Take 81 mg by mouth daily. Swallow whole.   No facility-administered encounter medications on file as of 08/18/2015.  :  Review of Systems:  Out of a complete 14 point review of systems, all are reviewed and negative with the exception of these symptoms as listed below:   Review of Systems  Neurological:       Patient reports trouble falling and staying asleep, snoring, witnessed apnea, wakes up gasping for air, wakes up feeling tired, morning headaches and dizziness, daytime tiredness, denies taking naps.    Epworth Sleepiness Scale 0= would never doze 1= slight chance of dozing 2= moderate chance of dozing 3= high chance of dozing  Sitting and reading:2 Watching TV:2 Sitting inactive in a public place (ex. Theater or meeting)0: As a passenger in a car for an hour without a break:2 Lying down to rest in the afternoon:2 Sitting and talking to someone:0 Sitting quietly after lunch (no alcohol):0 In a car, while stopped in traffic:0 Total:8   Objective:  Neurologic Exam  Physical Exam Physical Examination:   Filed Vitals:   08/18/15 1557  BP: 102/65  Pulse: 77  Resp: 16    General Examination: The patient is a very pleasant 40 y.o. female in no acute distress. She appears well-developed and  well-nourished and adequately groomed.   HEENT: Normocephalic, atraumatic, pupils are equal, round and reactive to light and accommodation. Funduscopic exam is normal with sharp disc margins noted. Extraocular tracking is good without limitation to gaze excursion or nystagmus noted. Normal smooth pursuit is noted. Hearing is grossly intact. Face is symmetric with normal facial animation and normal facial sensation. Speech is clear with no dysarthria noted. There is no hypophonia. There is no lip, neck/head, jaw or voice tremor. Neck is supple with full range of passive and active motion. There are no carotid bruits on auscultation. Oropharynx exam reveals: moderate mouth dryness, adequate dental hygiene and mild airway crowding, due to smaller airway entry. Mallampati is class I.  Tongue protrudes centrally and palate elevates symmetrically. Tonsils are absent. Neck size is 14 5/8 inches. She has a Mild overbite.   Chest: Clear to auscultation without wheezing, rhonchi or crackles noted.  Heart: S1+S2+0, regular and normal without murmurs, rubs or gallops noted.   Abdomen: Soft, non-tender and non-distended with normal bowel sounds appreciated on auscultation.  Extremities: There is no pitting edema in the distal lower extremities bilaterally. Pedal pulses are intact.  Skin: Warm and dry without trophic changes noted. There are no varicose veins.  Musculoskeletal: exam reveals no obvious joint deformities, tenderness or joint swelling or erythema.   Neurologically:  Mental status: The patient is awake, alert and oriented in all 4 spheres. Her immediate and remote memory, attention, language skills and fund of knowledge are appropriate. There is no evidence of aphasia, agnosia, apraxia or anomia. Speech is clear with normal prosody and enunciation. Thought process is linear. Mood is normal and affect is normal.  Cranial nerves II - XII are as described above under HEENT exam. In addition: shoulder  shrug is normal with equal shoulder height noted. Motor exam: Normal bulk, strength and tone is noted. There is no drift, tremor or rebound. Romberg shows a wide sway. Reflexes are 2+ throughout. Babinski: Toes are flexor bilaterally. Fine motor skills and coordination: intact with normal finger taps, normal hand movements, normal rapid alternating patting, normal foot taps and normal foot agility.  Cerebellar testing: No dysmetria or intention tremor on finger to nose testing. Heel to shin is unremarkable bilaterally. There is no truncal or gait ataxia.  Sensory exam: intact to light touch, pinprick, vibration, temperature sense in the upper and lower extremities.  Gait, station and balance: She stands easily. No veering to one side is noted. No leaning to one side is noted. Posture is age-appropriate and stance is narrow based. Gait shows normal stride length and normal pace. No problems turning are noted. She has trouble with tandem walk.   Assessment and Plan:  In summary, Gabby R Cornman is a very pleasant 40 y.o.-year old female with an underlying medical history of hyperlipidemia, reflux disease, IBS, B12 deficiency, migraine headaches, and obesity, whose history and physical exam are indeed concerning for obstructive sleep apnea (OSA). I had a long chat with the patient and her uncle about my findings and the diagnosis of OSA, its prognosis and treatment options. We talked about medical treatments, surgical interventions and non-pharmacological approaches. I explained in particular the risks and ramifications of untreated moderate to severe OSA, especially with respect to developing cardiovascular disease down the Road, including congestive heart failure, difficult to treat hypertension, cardiac arrhythmias, or stroke. Even type 2 diabetes has, in part, been linked to untreated OSA. Symptoms of untreated OSA include daytime sleepiness, memory problems, mood irritability and mood disorder such as  depression and anxiety, lack of energy, as well as recurrent headaches, especially morning headaches. We talked about smoking cessation and trying to maintain a healthy lifestyle in general, as well as the importance of weight control. I encouraged the patient to eat healthy, exercise daily and keep well hydrated, to keep a scheduled bedtime and wake time routine, to not skip any meals and eat healthy snacks in between meals. I advised the patient not to drive when feeling sleepy. I recommended the following at this time: sleep study with potential positive airway pressure titration. (We will score hypopneas at 4% and split the sleep study into diagnostic and treatment portion, if the estimated. 2 hour AHI is >  20/h).   I explained the sleep test procedure to the patient and also outlined possible surgical and non-surgical treatment options of OSA, including the use of a custom-made dental device (which would require a referral to a specialist dentist or oral surgeon), upper airway surgical options, such as pillar implants, radiofrequency surgery, tongue base surgery, and UPPP (which would involve a referral to an ENT surgeon). Rarely, jaw surgery such as mandibular advancement may be considered.  I also explained the CPAP treatment option to the patient, who indicated that she would be willing to try CPAP if the need arises. I explained the importance of being compliant with PAP treatment, not only for insurance purposes but primarily to improve Her symptoms, and for the patient's long term health benefit, including to reduce Her cardiovascular risks. I answered all her questions today and the patient was in agreement. I would like to see her back after the sleep study is completed and encouraged her to call with any interim questions, concerns, problems or updates.   Thank you very much for allowing me to participate in the care of this nice patient. If I can be of any further assistance to you please do not  hesitate to call me at [phone].  Sincerely,   Huston Foley, MD, PhD

## 2015-09-11 ENCOUNTER — Ambulatory Visit (INDEPENDENT_AMBULATORY_CARE_PROVIDER_SITE_OTHER): Payer: 59 | Admitting: Neurology

## 2015-09-11 DIAGNOSIS — G4710 Hypersomnia, unspecified: Secondary | ICD-10-CM | POA: Diagnosis not present

## 2015-09-11 DIAGNOSIS — G4720 Circadian rhythm sleep disorder, unspecified type: Secondary | ICD-10-CM

## 2015-09-12 ENCOUNTER — Telehealth: Payer: Self-pay | Admitting: Neurology

## 2015-09-12 NOTE — Telephone Encounter (Signed)
Patient referred by Dr. Sherwood Gambler, seen by me on 08/18/15, diagnostic PSG on 09/11/15.   Please call and notify the patient that the recent sleep study did not show any significant obstructive sleep apnea, leg twitching, snoring or intrinsic/organic sleep disorder. Please inform patient that we can play it by ear, or we can make a FU appointment if she would like to go over the details of the study during a follow up appointment. Arrange a followup if desired. Also, route or fax report to PCP.   Please remind patient to try to maintain good sleep hygiene, which means: Keep a regular sleep and wake schedule, try not to exercise or have a meal within 2 hours of your bedtime, try to keep your bedroom conducive for sleep, that is, cool and dark, without light distractors such as an illuminated alarm clock, and refrain from watching TV right before sleep or in the middle of the night and do not keep the TV or radio on during the night. Also, try not to use or play on electronic devices at bedtime, such as your cell phone, tablet PC or laptop. If you like to read at bedtime on an electronic device, try to dim the background light as much as possible. Do not eat in the middle of the night.   Once you have spoken to patient, you can close this encounter.   Thanks,  Huston Foley, MD, PhD [facility])

## 2015-09-12 NOTE — Sleep Study (Signed)
Please see the scanned sleep study interpretation located in the procedure tab within the chart review section.   

## 2015-09-16 NOTE — Telephone Encounter (Signed)
I spoke to patient and she is aware of results and recommendations. She will play it by ear and I will send report to PCP.

## 2015-11-28 ENCOUNTER — Ambulatory Visit (INDEPENDENT_AMBULATORY_CARE_PROVIDER_SITE_OTHER): Payer: 59 | Admitting: Cardiology

## 2015-11-28 ENCOUNTER — Encounter: Payer: Self-pay | Admitting: Cardiology

## 2015-11-28 VITALS — BP 120/80 | HR 79 | Ht 64.0 in | Wt 186.0 lb

## 2015-11-28 DIAGNOSIS — I739 Peripheral vascular disease, unspecified: Secondary | ICD-10-CM | POA: Diagnosis not present

## 2015-11-28 DIAGNOSIS — R079 Chest pain, unspecified: Secondary | ICD-10-CM | POA: Diagnosis not present

## 2015-11-28 DIAGNOSIS — E785 Hyperlipidemia, unspecified: Secondary | ICD-10-CM

## 2015-11-28 DIAGNOSIS — R002 Palpitations: Secondary | ICD-10-CM | POA: Diagnosis not present

## 2015-11-28 DIAGNOSIS — Z136 Encounter for screening for cardiovascular disorders: Secondary | ICD-10-CM | POA: Diagnosis not present

## 2015-11-28 MED ORDER — BUPROPION HCL ER (SR) 150 MG PO TB12: 150.0000 mg | ORAL_TABLET | Freq: Two times a day (BID) | ORAL | Status: DC

## 2015-11-28 MED ORDER — PRAVASTATIN SODIUM 20 MG PO TABS: 20.0000 mg | ORAL_TABLET | Freq: Every day | ORAL | Status: DC

## 2015-11-28 NOTE — Progress Notes (Signed)
Patient ID: MALANA ABDELFATTAH, female   DOB: 1976/01/08, 40 y.o.   MRN: [id number]     Clinical Summary Vanessa Lamb is a 40 y.o.female seen today as a new patient, she is referred by Dr Sherwood Gambler for the following medical problems.  1. Palpitations - - can feel like heart "flip" at times. Can occur rest or with activity. Lasts just a few seconds. Can feel lightheaded, anxious. Occcurs 2-3 times a day. - no coffee, sodas MTN dew x 4 cans per ay, occasional tea, no EtOH.  - ongoing symptoms for several months   2. Vascular disease - has f/u with vascular at [facility]. Recent CTA at [facility] with severe external carotid stenosis and subclavian disease   3.Chest pain - heaviness midchest. Can occur at rest or with activity. 6/10 in severity. Not positional. Can last several hours. Not related to food. No other related symptoms. Occurs daily. Seems to have increased after starting Co-Q 10. - walks regulary at work, up and down ladders. Denies any SOB/DOE.  - statin stopped due to possible fatigue. Simvastatin 40 stopped about 1 month ago.   Past Medical History  Diagnosis Date  . Hyperlipemia   . GERD (gastroesophageal reflux disease)   . IBS (irritable bowel syndrome) TCS SEP 2013    NL COLON/DUO Bx  . B12 deficiency   . Atrophic gastritis EGD SEP 2013  . Headache   . Interstitial cystitis      No Known Allergies   Current Outpatient Prescriptions  Medication Sig Dispense Refill  . cyanocobalamin (,VITAMIN B-12,) 1000 MCG/ML injection Inject into the muscle.    . pantoprazole (PROTONIX) 40 MG tablet Take 40 mg by mouth every morning.    . simvastatin (ZOCOR) 40 MG tablet Take 40 mg by mouth every evening.    . topiramate (TOPAMAX) 50 MG tablet TAKE 1 TABLET BY MOUTH AT BEDTIME. 30 tablet 0   No current facility-administered medications for this visit.     Past Surgical History  Procedure Laterality Date  . Abdominal hysterectomy  2005    complete  . Tubal ligation    . Wisdom  tooth extraction    . Colonoscopy  03/15/2012    Procedure: COLONOSCOPY;  Surgeon: West Bali, MD;  [facility];  Service: Endoscopy;  Laterality: N/A;  10:30 AM  . Esophagogastroduodenoscopy  03/15/2012    Procedure: ESOPHAGOGASTRODUODENOSCOPY (EGD);  Surgeon: West Bali, MD;  [facility];  Service: Endoscopy;  Laterality: N/A;  . Tonsillectomy       No Known Allergies    Family History  Problem Relation Age of Onset  . Hypertension Mother   . Hypertension Father   . Ulcers Father     PUD  . Colon cancer Maternal Grandfather 52  . Colonic polyp Son 9  . Diabetes Father   . Thyroid cancer Mother      Social History Vanessa Lamb reports that she has been smoking Cigarettes.  She has a 10 pack-year smoking history. She has never used smokeless tobacco. Vanessa Lamb reports that she does not drink alcohol.   Review of Systems CONSTITUTIONAL: No weight loss, fever, chills, weakness or fatigue.  HEENT: Eyes: No visual loss, blurred vision, double vision or yellow sclerae.No hearing loss, sneezing, congestion, runny nose or sore throat.  SKIN: No rash or itching.  CARDIOVASCULAR: per HPI RESPIRATORY: No shortness of breath, cough or sputum.  GASTROINTESTINAL: No anorexia, nausea, vomiting or diarrhea. No abdominal pain or blood.  GENITOURINARY: No burning on urination, no polyuria NEUROLOGICAL: No headache, dizziness, syncope, paralysis, ataxia, numbness or tingling in the extremities. No change in bowel or bladder control.  MUSCULOSKELETAL: No muscle, back pain, joint pain or stiffness.  LYMPHATICS: No enlarged nodes. No history of splenectomy.  PSYCHIATRIC: No history of depression or anxiety.  ENDOCRINOLOGIC: No reports of sweating, cold or heat intolerance. No polyuria or polydipsia.  Marland Kitchen   Physical Examination Filed Vitals:   11/28/15 0924  BP: 120/80  Pulse: 79   Filed Vitals:   11/28/15 0924  Height:  (1.626 m)  Weight: 186 lb  (84.369 kg)    Gen: resting comfortably, no acute distress HEENT: no scleral icterus, pupils equal round and reactive, no palptable cervical adenopathy,  CV: RRR, no m/r/g , no jvd Resp: Clear to auscultation bilaterally GI: abdomen is soft, non-tender, non-distended, normal bowel sounds, no hepatosplenomegaly MSK: extremities are warm, no edema.  Skin: warm, no rash Neuro:  no focal deficits Psych: appropriate affect    Assessment and Plan  1. Palpitations - she will wean caffeine and follow symptoms  2. PAD - significant external carotid and left subclavian disease. Denies any symptoms. Has upcoming appt with vacular  3. Chest pain - unclear etiology. In setting of known significant PAD she is at risk for CAD as etiology - EKG ishows SR, poor R-wave progression, no acute ischemic changes.  - we will obtain exercise stress test to further evaluate  4. Hyperlipidemia - trial of pravastatin  daily to see if tolerated.   F/u pending stress test   Antoine Poche, M.D.

## 2015-11-28 NOTE — Patient Instructions (Signed)
Medication Instructions:  Start pravastatin 20 mg daily Start wellbutrin 150 mg two times daily ( for first 3 days only take 150 mg once daily )   Labwork: none  Testing/Procedures: Your physician has requested that you have an exercise tolerance test. For further information please visit [email or web address] Please also follow instruction sheet, as given.    Follow-Up: Your physician recommends that you schedule a follow-up appointment in: 3 months    Any Other Special Instructions Will Be Listed Below (If Applicable).     If you need a refill on your cardiac medications before your next appointment, please call your pharmacy.

## 2015-12-05 ENCOUNTER — Inpatient Hospital Stay (HOSPITAL_COMMUNITY): Admission: RE | Admit: 2015-12-05 | Payer: 59 | Source: Ambulatory Visit

## 2015-12-12 ENCOUNTER — Ambulatory Visit (HOSPITAL_COMMUNITY)
Admission: RE | Admit: 2015-12-12 | Discharge: 2015-12-12 | Disposition: A | Discharge status: Home / Self Care | Payer: 59 | Source: Ambulatory Visit | Attending: Cardiology | Admitting: Cardiology

## 2015-12-12 DIAGNOSIS — R079 Chest pain, unspecified: Secondary | ICD-10-CM

## 2015-12-12 LAB — EXERCISE TOLERANCE TEST
CSEPED: 6 min
CSEPEDS: 1 s
CSEPPHR: 162 {beats}/min
Estimated workload: 7 METS
MPHR: 181 {beats}/min
Percent HR: 89 %
RPE: 12
Rest HR: 69 {beats}/min

## 2015-12-15 ENCOUNTER — Telehealth: Payer: Self-pay

## 2015-12-15 NOTE — Telephone Encounter (Signed)
-----   Message from Antoine Poche, MD sent at 12/15/2015  9:49 AM EDT ----- Stress test looks good, no evidence of any significant blockages. She needs to f/u with her pcp to discuss noncardiac causes of chest pain. F/u with [place] in 6 weeks to reevaluate symptoms  J BrancH MD

## 2015-12-15 NOTE — Telephone Encounter (Signed)
lmtcb 6/12- lm

## 2016-02-17 ENCOUNTER — Encounter: Payer: Self-pay | Admitting: Cardiology

## 2016-03-19 ENCOUNTER — Encounter: Payer: Self-pay | Admitting: Cardiology

## 2016-03-19 ENCOUNTER — Ambulatory Visit (INDEPENDENT_AMBULATORY_CARE_PROVIDER_SITE_OTHER): Payer: 59 | Admitting: Cardiology

## 2016-03-19 VITALS — BP 124/91 | HR 69 | Ht 64.0 in | Wt 193.0 lb

## 2016-03-19 DIAGNOSIS — I739 Peripheral vascular disease, unspecified: Secondary | ICD-10-CM | POA: Diagnosis not present

## 2016-03-19 DIAGNOSIS — R079 Chest pain, unspecified: Secondary | ICD-10-CM | POA: Diagnosis not present

## 2016-03-19 DIAGNOSIS — E785 Hyperlipidemia, unspecified: Secondary | ICD-10-CM

## 2016-03-19 DIAGNOSIS — R002 Palpitations: Secondary | ICD-10-CM | POA: Diagnosis not present

## 2016-03-19 DIAGNOSIS — I6523 Occlusion and stenosis of bilateral carotid arteries: Secondary | ICD-10-CM

## 2016-03-19 NOTE — Patient Instructions (Signed)
Your physician wants you to follow-up in: 1 year Dr Lurena Joiner will receive a reminder letter in the mail two months in advance. If you don't receive a letter, please call our office to schedule the follow-up appointment.    Your physician recommends that you continue on your current medications as directed. Please refer to the Current Medication list given to you today.    You have been referred to vascular surgeon for carotis stenosis      Thank you for choosing [facility] !

## 2016-03-19 NOTE — Progress Notes (Signed)
Clinical Summary [name] is a 40 y.o.female seen today for follow up of the following medical problems.   1. Palpitations - - can feel like heart "flips" at times. Can occur rest or with activity. Lasts just a few seconds. Can feel lightheaded, anxious. Occcurs 2-3 times a day. - no coffee, sodas MTN dew x 4 cans per ay, occasional tea, no EtOH.  - ongoing symptoms for several months  - has not cut back on caffeine since last visit. Continues to have symptoms.   2. Vascular disease - has f/u [facility] vascular at [facility]. [facility] with severe external carotid stenosis and subclavian disease - she missed her last appointment, working to reschedule. She has asked if there is a group in [place] she could follow with.   3.Chest pain - heaviness midchest. Can occur at rest or with activity. 6/10 in severity. Not positional. Can last several hours. Not related to food. No other related symptoms. Occurs daily. Seems to have increased after starting Co-Q 10. - walks regulary at work, up and down ladders. Denies any SOB/DOE.  - statin stopped due to possible fatigue. Simvastatin 40 stopped about 1 month ago.   - 12/2015 GXT negative for ischemia - still with occasional chest pain at times. Reports if she misses protonix symptoms significantly increased        SH: recent trip to [place].   Past Medical History:  Diagnosis Date  . Atrophic gastritis EGD SEP 2013  . B12 deficiency   . GERD (gastroesophageal reflux disease)   . Headache   . Hyperlipemia   . IBS (irritable bowel syndrome) TCS SEP 2013   NL COLON/DUO Bx  . Interstitial cystitis      No Known Allergies   Current Outpatient Prescriptions  Medication Sig Dispense Refill  . aspirin EC 81 MG tablet Take 81 mg by mouth daily.    Marland Kitchen. buPROPion (WELLBUTRIN SR) 150 MG 12 hr tablet Take 1 tablet (150 mg total) by mouth 2 (two) times daily. 60 tablet 3  . co-enzyme Q-10 50 MG capsule Take 50 mg by mouth  daily.    . cyanocobalamin (,VITAMIN B-12,) 1000 MCG/ML injection Inject into the muscle.    . pantoprazole (PROTONIX) 40 MG tablet Take 40 mg by mouth every morning.    . pravastatin (PRAVACHOL) 20 MG tablet Take 1 tablet (20 mg total) by mouth daily. 90 tablet 3  . topiramate (TOPAMAX) 50 MG tablet TAKE 1 TABLET BY MOUTH AT BEDTIME. 30 tablet 0   No current facility-administered medications for this visit.      Past Surgical History:  Procedure Laterality Date  . ABDOMINAL HYSTERECTOMY  2005   complete  . COLONOSCOPY  03/15/2012   Procedure: COLONOSCOPY;  Surgeon: West BaliSandi L Fields, MD;  [facility];  Service: Endoscopy;  Laterality: N/A;  10:30 AM  . ESOPHAGOGASTRODUODENOSCOPY  03/15/2012   Procedure: ESOPHAGOGASTRODUODENOSCOPY (EGD);  Surgeon: West BaliSandi L Fields, MD;  [facility];  Service: Endoscopy;  Laterality: N/A;  . TONSILLECTOMY    . TUBAL LIGATION    . WISDOM TOOTH EXTRACTION       No Known Allergies    Family History  Problem Relation Age of Onset  . Hypertension Mother   . Hypertension Father   . Ulcers Father     PUD  . Colon cancer Maternal Grandfather [id number]  . Colonic polyp Son 9  . Diabetes Father   . Thyroid cancer Mother  Social History [name] reports that she has been smoking Cigarettes.  She has a 10.00 pack-year smoking history. She has never used smokeless tobacco. [name] reports that she does not drink alcohol.   Review of Systems CONSTITUTIONAL: No weight loss, fever, chills, weakness or fatigue.  HEENT: Eyes: No visual loss, blurred vision, double vision or yellow sclerae.No hearing loss, sneezing, congestion, runny nose or sore throat.  SKIN: No rash or itching.  CARDIOVASCULAR: per HPI RESPIRATORY: No shortness of breath, cough or sputum.  GASTROINTESTINAL: No anorexia, nausea, vomiting or diarrhea. No abdominal pain or blood.  GENITOURINARY: No burning on urination, no polyuria NEUROLOGICAL: No headache,  dizziness, syncope, paralysis, ataxia, numbness or tingling in the extremities. No change in bowel or bladder control.  MUSCULOSKELETAL: No muscle, back pain, joint pain or stiffness.  LYMPHATICS: No enlarged nodes. No history of splenectomy.  PSYCHIATRIC: No history of depression or anxiety.  ENDOCRINOLOGIC: No reports of sweating, cold or heat intolerance. No polyuria or polydipsia.  Marland Kitchen   Physical Examination Vitals:   03/19/16 1033  BP: (!) 124/91  Pulse: 69   Vitals:   03/19/16 1033  Weight: 193 lb (87.5 kg)  Height: 5\' 4"  (1.626 m)    Gen: resting comfortably, no acute distress HEENT: no scleral icterus, pupils equal round and reactive, no palptable cervical adenopathy,  CV: RRR, no m/r/,g no jvd Resp: Clear to auscultation bilaterally GI: abdomen is soft, non-tender, non-distended, normal bowel sounds, no hepatosplenomegaly MSK: extremities are warm, no edema.  Skin: warm, no rash Neuro:  no focal deficits Psych: appropriate affect   Diagnostic Studies 12/2015 GXT  There was no ST segment deviation noted during stress.  Nonspecific T wave abnormalities with stress.  No arrhythmias.  Good exercise tolerance with low risk Duke treadmill score portends a favorable prognosis with respect to cardiovascular events.    Assessment and Plan   1. Palpitations - encouraged to wean caffeine. If symptoms persist can consider event monitor.   2. PAD - significant external carotid and left subclavian disease by previous CTA at [facility]. I am unsure of any significant clinical relevance of these findings, she had been seen at [facility] for this and is asking to be followed by vascular in [place] insteard. We will refer to vascular.   3. Chest pain - negative GXT, low risk treadmill score. Better with PPI - conitue to monitor at this time.   4. Hyperlipidemia - trial of pravastatin 20mg  daily to see if tolerated.  - muscle aches have resolved with lower dose  pravastatin.     F/u 1 year     Antoine Poche, M.D., F.A.C.C.

## 2016-04-12 ENCOUNTER — Other Ambulatory Visit: Payer: Self-pay

## 2016-04-12 DIAGNOSIS — I6529 Occlusion and stenosis of unspecified carotid artery: Secondary | ICD-10-CM

## 2016-04-26 ENCOUNTER — Encounter: Payer: Self-pay | Admitting: Vascular Surgery

## 2016-04-28 ENCOUNTER — Encounter: Payer: 59 | Admitting: Vascular Surgery

## 2016-04-28 ENCOUNTER — Encounter (HOSPITAL_COMMUNITY): Payer: 59

## 2016-06-07 ENCOUNTER — Encounter: Payer: Self-pay | Admitting: Vascular Surgery

## 2016-06-11 ENCOUNTER — Encounter: Payer: Self-pay | Admitting: Vascular Surgery

## 2016-06-11 ENCOUNTER — Ambulatory Visit (INDEPENDENT_AMBULATORY_CARE_PROVIDER_SITE_OTHER): Payer: 59 | Admitting: Vascular Surgery

## 2016-06-11 ENCOUNTER — Ambulatory Visit (HOSPITAL_COMMUNITY)
Admission: RE | Admit: 2016-06-11 | Discharge: 2016-06-11 | Disposition: A | Discharge status: Home / Self Care | Payer: 59 | Source: Ambulatory Visit | Attending: Vascular Surgery | Admitting: Vascular Surgery

## 2016-06-11 VITALS — BP 114/73 | HR 91 | Temp 97.4°F | Resp 16 | Ht 64.0 in | Wt 193.0 lb

## 2016-06-11 DIAGNOSIS — I6523 Occlusion and stenosis of bilateral carotid arteries: Secondary | ICD-10-CM | POA: Insufficient documentation

## 2016-06-11 DIAGNOSIS — I6529 Occlusion and stenosis of unspecified carotid artery: Secondary | ICD-10-CM | POA: Diagnosis not present

## 2016-06-11 LAB — VAS US CAROTID
LCCADSYS: -70 cm/s
LEFT ECA DIAS: -35 cm/s
Left CCA dist dias: -33 cm/s
Left CCA prox dias: 45 cm/s
Left CCA prox sys: 122 cm/s
Left ICA dist dias: -41 cm/s
Left ICA dist sys: -93 cm/s
Left ICA prox dias: -45 cm/s
Left ICA prox sys: -99 cm/s
RCCADSYS: -84 cm/s
RCCAPDIAS: 38 cm/s
RIGHT CCA MID DIAS: 32 cm/s
RIGHT ECA DIAS: -47 cm/s
Right CCA prox sys: 119 cm/s

## 2016-06-11 NOTE — Progress Notes (Signed)
Patient ID: Vanessa Lamb, female   DOB: 1975/12/29, 40 y.o.   MRN: [id number]  Reason for Consult: New Evaluation (bilateral  carotid stenosis)   Referred by Antoine PocheBranch, Jonathan F, MD  Subjective:     HPI:  Vanessa Lamb is a 40 y.o. female with a history of multiple neurologic symptoms specifically right upper or lower extremity weakness and right facial droop that was unexplained and she underwent workup for multiple sclerosis. Workup at [facility] demonstrated a CTA with external carotid artery stenosis as well as subclavian artery stenosis. She is also seen Dr. branch with [facility] or palpitations. He identified the arterial stenoses from [facility] and is now referred her here for vascular follow-up. She does not have a history of stroke mini stroke or amaurosis per patient and was ruled out for knees during her neurologic symptoms. She had quit smoking but now does smoke daily. She has been started on aspirin and statin which I have encouraged her to continue. She had subclavian stenoses and is left-handed which is ipsilateral to the stenosis but does not have any symptoms from this either and is able to perform all activities with her upper extremity. She has no limitation in her lower extremities to walking and does continue to work daily.  Past Medical History:  Diagnosis Date  . Atrophic gastritis EGD SEP 2013  . B12 deficiency   . GERD (gastroesophageal reflux disease)   . Headache   . Hyperlipemia   . IBS (irritable bowel syndrome) TCS SEP 2013   NL COLON/DUO Bx  . Interstitial cystitis    Family History  Problem Relation Age of Onset  . Hypertension Mother   . Hypertension Father   . Ulcers Father     PUD  . Colon cancer Maternal Grandfather [id number]  . Colonic polyp Son 9  . Diabetes Father   . Thyroid cancer Mother    Past Surgical History:  Procedure Laterality Date  . ABDOMINAL HYSTERECTOMY  2005   complete  . COLONOSCOPY  03/15/2012   Procedure: COLONOSCOPY;  Surgeon: West BaliSandi  L Fields, MD;  [facility];  Service: Endoscopy;  Laterality: N/A;  10:30 AM  . ESOPHAGOGASTRODUODENOSCOPY  03/15/2012   Procedure: ESOPHAGOGASTRODUODENOSCOPY (EGD);  Surgeon: West BaliSandi L Fields, MD;  [facility];  Service: Endoscopy;  Laterality: N/A;  . TONSILLECTOMY    . TUBAL LIGATION    . WISDOM TOOTH EXTRACTION      Short Social History:  Social History  Substance Use Topics  . Smoking status: Former Smoker    Packs/day: 1.00    Years: 10.00    Types: Cigarettes    Quit date: 12/04/2015  . Smokeless tobacco: Never Used     Comment: patient is aware she needs to stop   . Alcohol use No    No Known Allergies  Current Outpatient Prescriptions  Medication Sig Dispense Refill  . pantoprazole (PROTONIX) 40 MG tablet Take 40 mg by mouth every morning.    . pravastatin (PRAVACHOL) 20 MG tablet Take 1 tablet (20 mg total) by mouth daily. 90 tablet 3  . topiramate (TOPAMAX) 50 MG tablet TAKE 1 TABLET BY MOUTH AT BEDTIME. 30 tablet 0  . aspirin EC 81 MG tablet Take 81 mg by mouth daily.    . clindamycin (CLEOCIN T) 1 % external solution     . co-enzyme Q-10 50 MG capsule Take 50 mg by mouth daily.    . cyanocobalamin (,VITAMIN B-12,) 1000 MCG/ML injection Inject  into the muscle.    Marland Kitchen doxycycline (VIBRAMYCIN) 100 MG capsule      No current facility-administered medications for this visit.     Review of Systems  Constitutional:  Constitutional negative. HENT: HENT negative.  Eyes: Eyes negative.  Respiratory: Respiratory negative.  Cardiovascular: Positive for irregular heartbeat.  GI: Gastrointestinal negative.  GU: Genitourinary negative. Musculoskeletal: Musculoskeletal negative.  Skin: Skin negative.  Neurological: Positive for facial asymmetry and focal weakness.  Hematologic: Hematologic/lymphatic negative.  Psychiatric: Psychiatric negative.        Objective:  Objective   Vitals:   06/11/16 1355 06/11/16 1400  BP: 117/81 114/73  Pulse: 97 91   Resp: 16   Temp: 97.4 F (36.3 C)   SpO2: 98%   Weight: 193 lb (87.5 kg)   Height: 5\' 4"  (1.626 m)    Body mass index is 33.13 kg/m.  Physical Exam  Constitutional: She is oriented to person, place, and time. She appears well-developed.  HENT:  Head: Atraumatic.  Eyes: EOM are normal.  Neck: Neck supple.  Cardiovascular: Normal rate.   Pulses:      Popliteal pulses are 2+ on the right side, and 2+ on the left side.  Pulmonary/Chest: Effort normal.  Abdominal: Soft. She exhibits no mass.  Musculoskeletal: Normal range of motion. She exhibits no edema.  Lymphadenopathy:    She has no cervical adenopathy.  Neurological: She is alert and oriented to person, place, and time.  Skin: Skin is warm and dry.  Psychiatric: She has a normal mood and affect. Her behavior is normal. Judgment and thought content normal.    Data: Bilateral 1-39% proximal internal carotid artery stenoses. She is external carotid artery stenosis on the left peak systolic velocity 196. Triphasic waveform on the left with elevated velocity in the subclavian 204 cm/s. Vertebral flow is antegrade bilaterally.     Assessment/Plan:    Very pleasant 40 year old female sent for evaluation with known external carotid artery disease as well as subclavian artery disease on the left. She is asymptomatic from these not having had stroke or TIA in the past and has not had any evidence upper or lower extremity symptoms suggesting arterial disease either. Given this is her first evaluation that she is a dedicated smoker I recommended she continue aspirin and statin we can follow-up with carotid duplex in 2 years. Should she have issues between now and then we concern to see her sooner. We spent significant time discussing smoking cessation.     Maeola Harman MD [facility]

## 2016-06-14 NOTE — Addendum Note (Signed)
Addended by: Burton Apley A on: 06/14/2016 08:19 AM   Modules accepted: Orders

## 2016-11-12 DIAGNOSIS — D518 Other vitamin B12 deficiency anemias: Secondary | ICD-10-CM | POA: Diagnosis not present

## 2016-12-10 DIAGNOSIS — E539 Vitamin B deficiency, unspecified: Secondary | ICD-10-CM | POA: Diagnosis not present

## 2017-03-31 DIAGNOSIS — D518 Other vitamin B12 deficiency anemias: Secondary | ICD-10-CM | POA: Diagnosis not present

## 2017-05-24 DIAGNOSIS — E538 Deficiency of other specified B group vitamins: Secondary | ICD-10-CM | POA: Diagnosis not present

## 2017-05-24 DIAGNOSIS — L720 Epidermal cyst: Secondary | ICD-10-CM | POA: Diagnosis not present

## 2017-05-24 DIAGNOSIS — K219 Gastro-esophageal reflux disease without esophagitis: Secondary | ICD-10-CM | POA: Diagnosis not present

## 2017-07-28 DIAGNOSIS — Z1231 Encounter for screening mammogram for malignant neoplasm of breast: Secondary | ICD-10-CM | POA: Diagnosis not present

## 2018-04-11 DIAGNOSIS — N390 Urinary tract infection, site not specified: Secondary | ICD-10-CM | POA: Diagnosis not present

## 2018-04-11 DIAGNOSIS — N3010 Interstitial cystitis (chronic) without hematuria: Secondary | ICD-10-CM | POA: Diagnosis not present

## 2018-04-11 DIAGNOSIS — E6609 Other obesity due to excess calories: Secondary | ICD-10-CM | POA: Diagnosis not present

## 2018-06-16 ENCOUNTER — Ambulatory Visit: Payer: 59 | Admitting: Vascular Surgery

## 2018-06-16 ENCOUNTER — Encounter (HOSPITAL_COMMUNITY): Payer: 59

## 2018-08-18 DIAGNOSIS — Z0001 Encounter for general adult medical examination with abnormal findings: Secondary | ICD-10-CM | POA: Diagnosis not present

## 2018-08-18 DIAGNOSIS — Z23 Encounter for immunization: Secondary | ICD-10-CM | POA: Diagnosis not present

## 2018-08-18 DIAGNOSIS — I739 Peripheral vascular disease, unspecified: Secondary | ICD-10-CM | POA: Diagnosis not present

## 2018-08-21 ENCOUNTER — Encounter: Payer: Self-pay | Admitting: Cardiology

## 2018-08-21 DIAGNOSIS — E782 Mixed hyperlipidemia: Secondary | ICD-10-CM | POA: Diagnosis not present

## 2018-08-21 DIAGNOSIS — I7389 Other specified peripheral vascular diseases: Secondary | ICD-10-CM | POA: Diagnosis not present

## 2018-08-21 DIAGNOSIS — D510 Vitamin B12 deficiency anemia due to intrinsic factor deficiency: Secondary | ICD-10-CM | POA: Diagnosis not present

## 2018-08-25 ENCOUNTER — Ambulatory Visit (HOSPITAL_COMMUNITY)
Admission: RE | Admit: 2018-08-25 | Discharge: 2018-08-25 | Disposition: A | Discharge status: Home / Self Care | Payer: 59 | Source: Ambulatory Visit | Attending: Vascular Surgery | Admitting: Vascular Surgery

## 2018-08-25 ENCOUNTER — Ambulatory Visit (INDEPENDENT_AMBULATORY_CARE_PROVIDER_SITE_OTHER): Payer: 59 | Admitting: Vascular Surgery

## 2018-08-25 ENCOUNTER — Encounter: Payer: Self-pay | Admitting: Vascular Surgery

## 2018-08-25 VITALS — BP 126/83 | HR 73 | Temp 97.6°F | Resp 16 | Ht 64.0 in | Wt 193.0 lb

## 2018-08-25 DIAGNOSIS — I6523 Occlusion and stenosis of bilateral carotid arteries: Secondary | ICD-10-CM | POA: Insufficient documentation

## 2018-08-25 DIAGNOSIS — I739 Peripheral vascular disease, unspecified: Secondary | ICD-10-CM | POA: Diagnosis not present

## 2018-08-25 NOTE — Progress Notes (Signed)
Patient ID: Vanessa Lamb, female   DOB: 03-27-1976, 43 y.o.   MRN: [id number]  Reason for Consult: Carotid   Referred by Elfredia Nevins, MD  Subjective:     HPI:  Vanessa Lamb is a 43 y.o. female history of carotid artery stenosis bilaterally.  I saw her for this issue in December 2017 after CT scan at [facility] demonstrated the stenosis when she had some neurologic issues.  She does not have any history of stroke amaurosis or TIA.  She has now quit smoking.  Her chief complaint today is actually bilateral foot pain for which she is scheduled to see a podiatrist.  She does not have any tissue loss or ulceration.  She denies any frank claudication but states that by the end of the day she really has difficulty even walking because her foot pain is so bad.  She does not have any history of trauma.  Right foot hurts worse than left.  She has no outward changes such as deformity or bruising to the feet.  She has never had foot surgery in the past.  She does take aspirin daily.  Past Medical History:  Diagnosis Date  . Atrophic gastritis EGD SEP 2013  . B12 deficiency   . GERD (gastroesophageal reflux disease)   . Headache   . Hyperlipemia   . IBS (irritable bowel syndrome) TCS SEP 2013   NL COLON/DUO Bx  . Interstitial cystitis    Family History  Problem Relation Age of Onset  . Colon cancer Maternal Grandfather 27  . Colonic polyp Son 9  . Hypertension Mother   . Thyroid cancer Mother   . Hypertension Father   . Ulcers Father        PUD  . Diabetes Father    Past Surgical History:  Procedure Laterality Date  . ABDOMINAL HYSTERECTOMY  2005   complete  . COLONOSCOPY  03/15/2012   Procedure: COLONOSCOPY;  Surgeon: West Bali, MD;  [facility];  Service: Endoscopy;  Laterality: N/A;  10:30 AM  . ESOPHAGOGASTRODUODENOSCOPY  03/15/2012   Procedure: ESOPHAGOGASTRODUODENOSCOPY (EGD);  Surgeon: West Bali, MD;  [facility];  Service: Endoscopy;  Laterality:  N/A;  . TONSILLECTOMY    . TUBAL LIGATION    . WISDOM TOOTH EXTRACTION      Short Social History:  Social History   Tobacco Use  . Smoking status: Former Smoker    Packs/day: 1.00    Years: 10.00    Pack years: 10.00    Types: Cigarettes    Last attempt to quit: 12/04/2015    Years since quitting: 2.7  . Smokeless tobacco: Never Used  . Tobacco comment: patient is aware she needs to stop   Substance Use Topics  . Alcohol use: No    Alcohol/week: 0.0 standard drinks    No Known Allergies  Current Outpatient Medications  Medication Sig Dispense Refill  . aspirin EC 81 MG tablet Take 81 mg by mouth daily.    Marland Kitchen atorvastatin (LIPITOR) 80 MG tablet Take 80 mg by mouth daily.    . pantoprazole (PROTONIX) 40 MG tablet Take 40 mg by mouth every morning.    . topiramate (TOPAMAX) 50 MG tablet TAKE 1 TABLET BY MOUTH AT BEDTIME. 30 tablet 0  . clindamycin (CLEOCIN T) 1 % external solution     . co-enzyme Q-10 50 MG capsule Take 50 mg by mouth daily.    . cyanocobalamin (,VITAMIN B-12,) 1000 MCG/ML injection  Inject into the muscle.    Marland Kitchen doxycycline (VIBRAMYCIN) 100 MG capsule     . pravastatin (PRAVACHOL) 20 MG tablet Take 1 tablet (20 mg total) by mouth daily. (Patient not taking: Reported on 08/25/2018) 90 tablet 3   No current facility-administered medications for this visit.     Review of Systems  Constitutional:  Constitutional negative. HENT: HENT negative.  Eyes: Eyes negative.  Respiratory: Positive for shortness of breath.  Musculoskeletal: Positive for gait problem and joint pain.  Skin: Skin negative.  Neurological: Positive for focal weakness.  Hematologic: Hematologic/lymphatic negative.  Psychiatric: Psychiatric negative.        Objective:  Objective   Vitals:   08/25/18 1127 08/25/18 1135  BP: 125/82 126/83  Pulse: 73 73  Resp: 16   Temp: 97.6 F (36.4 C)   TempSrc: Oral   SpO2: 100%   Weight: 193 lb (87.5 kg)   Height: 5\' 4"  (1.626 m)    Body mass  index is 33.13 kg/m.  Physical Exam HENT:     Head: Normocephalic.  Eyes:     Pupils: Pupils are equal, round, and reactive to light.  Cardiovascular:     Rate and Rhythm: Normal rate.     Pulses:          Femoral pulses are 2+ on the right side and 2+ on the left side.      Popliteal pulses are 0 on the right side and 1+ on the left side.       Dorsalis pedis pulses are 0 on the right side and 0 on the left side.       Posterior tibial pulses are 0 on the right side and 0 on the left side.     Comments: There are monophasic bilateral DP and PT signals Pulmonary:     Effort: Pulmonary effort is normal.  Abdominal:     General: Abdomen is flat.     Palpations: Abdomen is soft.  Musculoskeletal: Normal range of motion.        General: No swelling.  Skin:    General: Skin is dry.     Capillary Refill: Capillary refill takes less than 2 seconds.  Neurological:     General: No focal deficit present.     Mental Status: She is alert.  Psychiatric:        Mood and Affect: Mood normal.        Thought Content: Thought content normal.        Judgment: Judgment normal.     Data: I have independently interpreted her carotid duplex to be 1 to 39% stenosis bilaterally     Assessment/Plan:     43 year old female here for carotid artery stenosis evaluation.  This is stable and she is asymptomatic from the standpoint can follow-up in 2 years with repeat duplex.  She does have ongoing foot pain is to set to be seen by podiatry we will check her ABIs bilateral lower extremity duplex given that I cannot palpate pulses in her feet.  She has quit smoking I congratulated her.  She will continue aspirin.     Maeola Harman MD [facility]

## 2018-08-29 DIAGNOSIS — M7732 Calcaneal spur, left foot: Secondary | ICD-10-CM | POA: Diagnosis not present

## 2018-08-29 DIAGNOSIS — M722 Plantar fascial fibromatosis: Secondary | ICD-10-CM | POA: Diagnosis not present

## 2018-08-29 DIAGNOSIS — M7731 Calcaneal spur, right foot: Secondary | ICD-10-CM | POA: Diagnosis not present

## 2018-09-18 DIAGNOSIS — M79671 Pain in right foot: Secondary | ICD-10-CM | POA: Diagnosis not present

## 2018-09-18 DIAGNOSIS — M722 Plantar fascial fibromatosis: Secondary | ICD-10-CM | POA: Diagnosis not present

## 2018-09-18 DIAGNOSIS — M25579 Pain in unspecified ankle and joints of unspecified foot: Secondary | ICD-10-CM | POA: Diagnosis not present

## 2018-09-28 DIAGNOSIS — J22 Unspecified acute lower respiratory infection: Secondary | ICD-10-CM | POA: Diagnosis not present

## 2018-09-28 DIAGNOSIS — Z6832 Body mass index (BMI) 32.0-32.9, adult: Secondary | ICD-10-CM | POA: Diagnosis not present

## 2018-09-28 DIAGNOSIS — E6609 Other obesity due to excess calories: Secondary | ICD-10-CM | POA: Diagnosis not present

## 2018-09-29 ENCOUNTER — Encounter (HOSPITAL_COMMUNITY): Payer: 59

## 2018-09-29 ENCOUNTER — Ambulatory Visit: Payer: 59 | Admitting: Vascular Surgery

## 2018-10-02 ENCOUNTER — Ambulatory Visit: Payer: Self-pay | Admitting: Cardiology

## 2018-10-09 DIAGNOSIS — M722 Plantar fascial fibromatosis: Secondary | ICD-10-CM | POA: Diagnosis not present

## 2018-10-09 DIAGNOSIS — M79671 Pain in right foot: Secondary | ICD-10-CM | POA: Diagnosis not present

## 2018-10-09 DIAGNOSIS — M25579 Pain in unspecified ankle and joints of unspecified foot: Secondary | ICD-10-CM | POA: Diagnosis not present

## 2018-10-12 ENCOUNTER — Ambulatory Visit: Payer: Self-pay | Admitting: Cardiology

## 2018-10-19 ENCOUNTER — Telehealth: Payer: Self-pay | Admitting: Cardiology

## 2018-10-19 NOTE — Telephone Encounter (Signed)
Virtual Visit Pre-Appointment Phone Call  Steps For Call:  1. Confirm consent - "In the setting of the current Covid19 crisis, you are scheduled for a (phone or video) visit with your provider on (date) at (time).  Just as we do with many in-office visits, in order for you to participate in this visit, we must obtain consent.  If you'd like, I can send this to your mychart (if signed up) or email for you to review.  Otherwise, I can obtain your verbal consent now.  All virtual visits are billed to your insurance company just like a normal visit would be.  By agreeing to a virtual visit, we'd like you to understand that the technology does not allow for your provider to perform an examination, and thus may limit your provider's ability to fully assess your condition.  Finally, though the technology is pretty good, we cannot assure that it will always work on either your or our end, and in the setting of a video visit, we may have to convert it to a phone-only visit.  In either situation, we cannot ensure that we have a secure connection.  Are you willing to proceed?" STAFF: Did the patient verbally acknowledge consent to telehealth visit? Document YES/NO here: YES   2. Confirm the BEST phone number to call the day of the visit by including in appointment notes [phone]  3. Give patient instructions for WebEx/MyChart download to smartphone as below or Doximity/Doxy.me if video visit (depending on what platform provider is using)  4. Advise patient to be prepared with their blood pressure, heart rate, weight, any heart rhythm information, their current medicines, and a piece of paper and pen handy for any instructions they may receive the day of their visit  5. Inform patient they will receive a phone call 15 minutes prior to their appointment time (may be from unknown caller ID) so they should be prepared to answer  6. Confirm that appointment type is correct in Epic appointment notes (VIDEO vs  PHONE)     TELEPHONE CALL NOTE  Vanessa Lamb has been deemed a candidate for a follow-up tele-health visit to limit community exposure during the Covid-19 pandemic. I spoke with the patient via phone to ensure availability of phone/video source, confirm preferred email & phone number, and discuss instructions and expectations.  I reminded Vanessa Lamb to be prepared with any vital sign and/or heart rhythm information that could potentially be obtained via home monitoring, at the time of her visit. I reminded Vanessa Lamb to expect a phone call at the time of her visit if her visit.  Vanessa Lamb 10/19/2018 2:02 PM   INSTRUCTIONS FOR DOWNLOADING THE WEBEX APP TO SMARTPHONE  - If Apple, ask patient to go to [employer] and type in WebEx in the search bar. Download [employer], the blue/green circle. If Android, go to [employer] and type in [employer] in the search bar. The app is free but as with any other app downloads, their phone may require them to verify saved payment information or Apple/Android password.  - The patient does NOT have to create an account. - On the day of the visit, the assist will walk the patient through joining the meeting with the meeting number/password.  INSTRUCTIONS FOR DOWNLOADING THE MYCHART APP TO SMARTPHONE  - The patient must first make sure to have activated MyChart and know their login information - If Apple, go to [employer] and  type in MyChart in the search bar and download the app. If Android, ask patient to go to [employer] and type in [place] in the search bar and download the app. The app is free but as with any other app downloads, their phone may require them to verify saved payment information or Apple/Android password.  - The patient will need to then log into the app with their MyChart username and password, and select [facility] as their healthcare provider to link the account. When it is time for your visit, go to the MyChart  app, find appointments, and click Begin Video Visit. Be sure to Select Allow for your device to access the Microphone and Camera for your visit. You will then be connected, and your provider will be with you shortly.  **If they have any issues connecting, or need assistance please contact MyChart service desk [phone])**  **If using a computer, in order to ensure the best quality for their visit they will need to use either of the following Internet Browsers: [employer] or Google Chrome**  IF USING DOXIMITY or DOXY.ME - The patient will receive a link just prior to their visit, either by text or email (to be determined day of appointment depending on if it's doxy.me or Doximity).     FULL LENGTH CONSENT FOR TELE-HEALTH VISIT   I hereby voluntarily request, consent and authorize [facility] and its employed or contracted physicians, physician assistants, nurse practitioners or other licensed health care professionals (the Practitioner), to provide me with telemedicine health care services (the Services") as deemed necessary by the treating Practitioner. I acknowledge and consent to receive the Services by the Practitioner via telemedicine. I understand that the telemedicine visit will involve communicating with the Practitioner through live audiovisual communication technology and the disclosure of certain medical information by electronic transmission. I acknowledge that I have been given the opportunity to request an in-person assessment or other available alternative prior to the telemedicine visit and am voluntarily participating in the telemedicine visit.  I understand that I have the right to withhold or withdraw my consent to the use of telemedicine in the course of my care at any time, without affecting my right to future care or treatment, and that the Practitioner or I may terminate the telemedicine visit at any time. I understand that I have the right to inspect all  information obtained and/or recorded in the course of the telemedicine visit and may receive copies of available information for a reasonable fee.  I understand that some of the potential risks of receiving the Services via telemedicine include:   Delay or interruption in medical evaluation due to technological equipment failure or disruption;  Information transmitted may not be sufficient (e.g. poor resolution of images) to allow for appropriate medical decision making by the Practitioner; and/or   In rare instances, security protocols could fail, causing a breach of personal health information.  Furthermore, I acknowledge that it is my responsibility to provide information about my medical history, conditions and care that is complete and accurate to the best of my ability. I acknowledge that Practitioner's advice, recommendations, and/or decision may be based on factors not within their control, such as incomplete or inaccurate data provided by me or distortions of diagnostic images or specimens that may result from electronic transmissions. I understand that the practice of medicine is not an exact science and that Practitioner makes no warranties or guarantees regarding treatment outcomes. I acknowledge that I will receive  a copy of this consent concurrently upon execution via email to the email address I last provided but may also request a printed copy by calling the office of [facility].    I understand that my insurance will be billed for this visit.   I have read or had this consent read to me.  I understand the contents of this consent, which adequately explains the benefits and risks of the Services being provided via telemedicine.   I have been provided ample opportunity to ask questions regarding this consent and the Services and have had my questions answered to my satisfaction.  I give my informed consent for the services to be provided through the use of telemedicine in my  medical care  By participating in this telemedicine visit I agree to the above.

## 2018-10-24 ENCOUNTER — Encounter: Payer: Self-pay | Admitting: Cardiology

## 2018-10-24 ENCOUNTER — Telehealth (INDEPENDENT_AMBULATORY_CARE_PROVIDER_SITE_OTHER): Payer: 59 | Admitting: Cardiology

## 2018-10-24 VITALS — Ht 64.0 in | Wt 189.0 lb

## 2018-10-24 DIAGNOSIS — R002 Palpitations: Secondary | ICD-10-CM

## 2018-10-24 DIAGNOSIS — R0789 Other chest pain: Secondary | ICD-10-CM

## 2018-10-24 DIAGNOSIS — I6523 Occlusion and stenosis of bilateral carotid arteries: Secondary | ICD-10-CM

## 2018-10-24 NOTE — Progress Notes (Signed)
Medication Instructions:  Your physician recommends that you continue on your current medications as directed. Please refer to the Current Medication list given to you today.   Labwork: I will request labs form pcp  Testing/Procedures: none  Follow-Up:Your physician wants you to follow-up in: 1 year. You will receive a reminder letter in the mail two months in advance. If you don't receive a letter, please call our office to schedule the follow-up appointment.   Any Other Special Instructions Will Be Listed Below (If Applicable).     If you need a refill on your cardiac medications before your next appointment, please call your pharmacy.

## 2018-10-24 NOTE — Progress Notes (Signed)
Virtual Visit via Telephone Note   This visit type was conducted due to national recommendations for restrictions regarding the COVID-19 Pandemic (e.g. social distancing) in an effort to limit this patient's exposure and mitigate transmission in our community.  Due to her co-morbid illnesses, this patient is at least at moderate risk for complications without adequate follow up.  This format is felt to be most appropriate for this patient at this time.  The patient did not have access to video technology/had technical difficulties with video requiring transitioning to audio format only (telephone).  All issues noted in this document were discussed and addressed.  No physical exam could be performed with this format.  Please refer to the patient's chart for her  consent to telehealth for [facility].   Evaluation Performed:  Follow-up visit  Date:  10/24/2018   ID:  Vanessa Lamb, DOB May 06, 1976, MRN [id number]  Patient Location: Home Provider Location: Home  PCP:  Elfredia Nevins, MD  Cardiologist:  Dr Dina Rich MD Electrophysiologist:  None   Chief Complaint:  1 year follow up  History of Present Illness:    Vanessa Lamb is a 43 y.o. female with the following medical problems.   1. Palpitations - long history of symptoms. Continues to have fairly high caffeine intake - has palpitations at times. Symptoms daily,though mild. Just a few skips at a time.   2. Vascular disease/Carotid stenosis - followed by vascular - 08/2018 carotid US mild bilateral ICA disease    3.Chest pain - heaviness midchest. Can occur at rest or with activity. 6/10 in severity. Not positional. Can last several hours. Not related to food. No other related symptoms. Occurs daily. Seems to have increased after starting Co-Q 10. - walks regulary at work, up and down ladders. Denies any SOB/DOE.  - statin stopped due to possible fatigue. Simvastatin 40 stopped about 1 month ago.   - 12/2015 GXT  negative for ischemia - still with occasional chest pain at times. Reports if she misses protonix symptoms significantly increased    - no recent symptoms    4. Leg pains - bilateral pain in calves and feet.  - worst with sitting down and standing. - seen by podiatry as well - awaiting ABIs, delayed due to COVID-19 limitations.    The patient does not have symptoms concerning for COVID-19 infection (fever, chills, cough, or new shortness of breath).    Past Medical History:  Diagnosis Date  . Atrophic gastritis EGD SEP 2013  . B12 deficiency   . GERD (gastroesophageal reflux disease)   . Headache   . Hyperlipemia   . IBS (irritable bowel syndrome) TCS SEP 2013   NL COLON/DUO Bx  . Interstitial cystitis    Past Surgical History:  Procedure Laterality Date  . ABDOMINAL HYSTERECTOMY  2005   complete  . COLONOSCOPY  03/15/2012   Procedure: COLONOSCOPY;  Surgeon: West Bali, MD;  Location: [facility];  Service: Endoscopy;  Laterality: N/A;  10:30 AM  . ESOPHAGOGASTRODUODENOSCOPY  03/15/2012   Procedure: ESOPHAGOGASTRODUODENOSCOPY (EGD);  Surgeon: West Bali, MD;  Location: [facility];  Service: Endoscopy;  Laterality: N/A;  . TONSILLECTOMY    . TUBAL LIGATION    . WISDOM TOOTH EXTRACTION       Current Meds  Medication Sig  . aspirin EC 81 MG tablet Take 81 mg by mouth daily.  Marland Kitchen atorvastatin (LIPITOR) 80 MG tablet Take 80 mg by mouth daily.  . cyanocobalamin (,VITAMIN  B-12,) 1000 MCG/ML injection Inject into the muscle.  . pantoprazole (PROTONIX) 40 MG tablet Take 40 mg by mouth every morning.  . topiramate (TOPAMAX) 50 MG tablet TAKE 1 TABLET BY MOUTH AT BEDTIME.     Allergies:   Patient has no known allergies.   Social History   Tobacco Use  . Smoking status: Former Smoker    Packs/day: 1.00    Years: 10.00    Pack years: 10.00    Types: Cigarettes    Last attempt to quit: 12/04/2015    Years since quitting: 2.8  . Smokeless tobacco: Never Used  .  Tobacco comment: patient is aware she needs to stop   Substance Use Topics  . Alcohol use: No    Alcohol/week: 0.0 standard drinks  . Drug use: No     Family Hx: The patient's family history includes Colon cancer (age of onset: 60) in her maternal grandfather; Colonic polyp (age of onset: 99) in her son; Diabetes in her father; Hypertension in her father and mother; Thyroid cancer in her mother; Ulcers in her father.  ROS:   Please see the history of present illness.     All other systems reviewed and are negative.   Prior CV studies:   The following studies were reviewed today:  12/2015 GXT  There was no ST segment deviation noted during stress.  Nonspecific T wave abnormalities with stress.  No arrhythmias.  Good exercise tolerance with low risk Duke treadmill score portends a favorable prognosis with respect to cardiovascular events.  Labs/Other Tests and Data Reviewed:    EKG:  na  Recent Labs: No results found for requested labs within last 8760 hours.   Recent Lipid Panel No results found for: CHOL, TRIG, HDL, CHOLHDL, LDLCALC, LDLDIRECT  Wt Readings from Last 3 Encounters:  08/25/18 193 lb (87.5 kg)  06/11/16 193 lb (87.5 kg)  03/19/16 193 lb (87.5 kg)     Objective:    Vital Signs:  None avaialble  Normal affect. Normal speech pattern and tone. Sounds comfortable in no distress. No auditory signs of SOB or wheezing.   ASSESSMENT & PLAN:    1. Palpitations - mild symptoms, continue to work to limit caffeine. Still with fairly high intake - if progression of symptoms would plan for monitor.   2. PAD -followed by vascular - awaiting ABIs due to leg and foot pain, delayed due to COVID-19 limitations.   3. Chest pain - negative GXT, low risk treadmill score. Better with PPI - no recent symptoms, continue to monitor.   4. Hyperlipidemia - continue staitn, request labs from pcp      COVID-19 Education: The signs and symptoms of COVID-19 were  discussed with the patient and how to seek care for testing (follow up with PCP or arrange E-visit).  The importance of social distancing was discussed today.  Time:   Today, I have spent 22 minutes with the patient with telehealth technology discussing the above problems.     Medication Adjustments/Labs and Tests Ordered: Current medicines are reviewed at length with the patient today.  Concerns regarding medicines are outlined above.   Tests Ordered: No orders of the defined types were placed in this encounter.   Medication Changes: No orders of the defined types were placed in this encounter.   Disposition:  Follow up 1 year  Signed, Dina Rich, MD  10/24/2018 7:56 AM    [facility]

## 2018-11-03 ENCOUNTER — Emergency Department (HOSPITAL_COMMUNITY): Payer: 59

## 2018-11-03 ENCOUNTER — Emergency Department (HOSPITAL_COMMUNITY)
Admission: EM | Admit: 2018-11-03 | Discharge: 2018-11-03 | Disposition: A | Discharge status: Home / Self Care | Payer: 59 | Attending: Emergency Medicine | Admitting: Emergency Medicine

## 2018-11-03 ENCOUNTER — Encounter (HOSPITAL_COMMUNITY): Payer: Self-pay

## 2018-11-03 ENCOUNTER — Other Ambulatory Visit: Payer: Self-pay

## 2018-11-03 DIAGNOSIS — N132 Hydronephrosis with renal and ureteral calculous obstruction: Secondary | ICD-10-CM | POA: Diagnosis not present

## 2018-11-03 DIAGNOSIS — N201 Calculus of ureter: Secondary | ICD-10-CM | POA: Diagnosis not present

## 2018-11-03 DIAGNOSIS — Z7982 Long term (current) use of aspirin: Secondary | ICD-10-CM | POA: Diagnosis not present

## 2018-11-03 DIAGNOSIS — N23 Unspecified renal colic: Secondary | ICD-10-CM | POA: Diagnosis not present

## 2018-11-03 DIAGNOSIS — Z79899 Other long term (current) drug therapy: Secondary | ICD-10-CM | POA: Insufficient documentation

## 2018-11-03 DIAGNOSIS — Z87891 Personal history of nicotine dependence: Secondary | ICD-10-CM | POA: Diagnosis not present

## 2018-11-03 DIAGNOSIS — R109 Unspecified abdominal pain: Secondary | ICD-10-CM | POA: Diagnosis present

## 2018-11-03 DIAGNOSIS — N200 Calculus of kidney: Secondary | ICD-10-CM | POA: Insufficient documentation

## 2018-11-03 LAB — URINALYSIS, ROUTINE W REFLEX MICROSCOPIC
Bilirubin Urine: NEGATIVE
Glucose, UA: NEGATIVE mg/dL
Ketones, ur: NEGATIVE mg/dL
Leukocytes,Ua: NEGATIVE
Nitrite: NEGATIVE
Protein, ur: NEGATIVE mg/dL
RBC / HPF: >50 RBC/hpf — ABNORMAL HIGH (ref 0–5)
Specific Gravity, Urine: 1.017 (ref 1.005–1.030)
pH: 5 (ref 5.0–8.0)

## 2018-11-03 LAB — CBC WITH DIFFERENTIAL/PLATELET
Abs Immature Granulocytes: 0.01 10*3/uL (ref 0.00–0.07)
Basophils Absolute: 0 10*3/uL (ref 0.0–0.1)
Basophils Relative: 1 %
Eosinophils Absolute: 0.1 10*3/uL (ref 0.0–0.5)
Eosinophils Relative: 1 %
HCT: 40.5 % (ref 36.0–46.0)
Hemoglobin: 13.6 g/dL (ref 12.0–15.0)
Immature Granulocytes: 0 %
Lymphocytes Relative: 26 %
Lymphs Abs: 2.1 10*3/uL (ref 0.7–4.0)
MCH: 32.2 pg (ref 26.0–34.0)
MCHC: 33.6 g/dL (ref 30.0–36.0)
MCV: 96 fL (ref 80.0–100.0)
Monocytes Absolute: 0.4 10*3/uL (ref 0.1–1.0)
Monocytes Relative: 5 %
Neutro Abs: 5.3 10*3/uL (ref 1.7–7.7)
Neutrophils Relative %: 67 %
Platelets: 207 10*3/uL (ref 150–400)
RBC: 4.22 MIL/uL (ref 3.87–5.11)
RDW: 12.4 % (ref 11.5–15.5)
WBC: 7.9 10*3/uL (ref 4.0–10.5)
nRBC: 0 % (ref 0.0–0.2)

## 2018-11-03 LAB — COMPREHENSIVE METABOLIC PANEL
ALT: 16 U/L (ref 0–44)
AST: 14 U/L — ABNORMAL LOW (ref 15–41)
Albumin: 4.1 g/dL (ref 3.5–5.0)
Alkaline Phosphatase: 82 U/L (ref 38–126)
Anion gap: 7 (ref 5–15)
BUN: 10 mg/dL (ref 6–20)
CO2: 24 mmol/L (ref 22–32)
Calcium: 9.2 mg/dL (ref 8.9–10.3)
Chloride: 112 mmol/L — ABNORMAL HIGH (ref 98–111)
Creatinine, Ser: 0.88 mg/dL (ref 0.44–1.00)
GFR calc Af Amer: >60 mL/min (ref >60)
GFR calc non Af Amer: >60 mL/min (ref >60)
Glucose, Bld: 111 mg/dL — ABNORMAL HIGH (ref 70–99)
Potassium: 4 mmol/L (ref 3.5–5.1)
Sodium: 143 mmol/L (ref 135–145)
Total Bilirubin: 0.4 mg/dL (ref 0.3–1.2)
Total Protein: 6.9 g/dL (ref 6.5–8.1)

## 2018-11-03 LAB — LIPASE, BLOOD: Lipase: 29 U/L (ref 11–51)

## 2018-11-03 MED ORDER — IOHEXOL 300 MG/ML  SOLN
100.0000 mL | Freq: Once | INTRAMUSCULAR | Status: AC | PRN
  Administered 2018-11-03: 13:00:00 100 mL via INTRAVENOUS

## 2018-11-03 MED ORDER — ONDANSETRON HCL 4 MG/2ML IJ SOLN
4.0000 mg | Freq: Once | INTRAMUSCULAR | Status: AC
  Administered 2018-11-03: 10:00:00 4 mg via INTRAVENOUS
  Filled 2018-11-03: qty 2

## 2018-11-03 MED ORDER — KETOROLAC TROMETHAMINE 30 MG/ML IJ SOLN
30.0000 mg | Freq: Once | INTRAMUSCULAR | Status: AC
  Administered 2018-11-03: 30 mg via INTRAVENOUS
  Filled 2018-11-03: qty 1

## 2018-11-03 MED ORDER — ONDANSETRON HCL 4 MG PO TABS: 4.0000 mg | ORAL_TABLET | Freq: Four times a day (QID) | ORAL | 0 refills | Status: DC

## 2018-11-03 MED ORDER — OXYCODONE-ACETAMINOPHEN 5-325 MG PO TABS: 1.0000 | ORAL_TABLET | Freq: Four times a day (QID) | ORAL | 0 refills | Status: DC | PRN

## 2018-11-03 MED ORDER — MELOXICAM 15 MG PO TABS: 15.0000 mg | ORAL_TABLET | Freq: Every day | ORAL | 0 refills | Status: DC

## 2018-11-03 NOTE — ED Provider Notes (Signed)
[facility] EMERGENCY DEPARTMENT Provider Note   CSN: [id number] Arrival date & time: 11/03/18  0901    History   Chief Complaint Chief Complaint  Patient presents with  . Abdominal Pain    HPI Vanessa Lamb is a 43 y.o. female.     Patient is a 43 year old female who presents to the emergency department with complaint of abdominal pain. Patient states she has been having problems with her abdomen and back since March of this year.  She was evaluated by her primary physician.  She says that she is not getting any better.  On yesterday April 30 she states that the pain in her abdomen and back got worse.  And this morning it was unbearable.  The pain is mostly on the right side of the abdomen as well as the back.  It is not associated specifically with vomiting.  There has been some nausea related to it.  The patient states she has irritable bowel syndrome, so she has problems with diarrhea and constipation on a regular basis.  She says she has had some of both this week.  There is been no recent injury or trauma to the abdomen or back.  No recent operations or procedures.  No high fever reported.  The patient states she has not had any blood in her urine.  No increased urine frequency or urgency.  No pain with urination.  She has had some blood in her stool, but she says no more than her usual related to her irritable bowel.  She presents now because she says the pain in her abdomen and back seems to be getting worse.  The pain in her back is almost unbearable.   The history is provided by the patient.    Past Medical History:  Diagnosis Date  . Atrophic gastritis EGD SEP 2013  . B12 deficiency   . GERD (gastroesophageal reflux disease)   . Headache   . Hyperlipemia   . IBS (irritable bowel syndrome) TCS SEP 2013   NL COLON/DUO Bx  . Interstitial cystitis     Patient Active Problem List   Diagnosis Date Noted  . Atypical migraine 03/14/2015  . White matter abnormality on MRI of  brain 03/14/2015  . B12 deficiency 03/14/2015  . Tobacco dependence 03/14/2015  . Depression 11/02/2013  . Migraines 11/02/2013  . Nausea 03/09/2012  . Chronic diarrhea 03/09/2012  . Tobacco abuse 02/28/2012  . Acute colitis 02/27/2012  . Hyperlipidemia 02/27/2012  . Obesity 02/27/2012  . GERD (gastroesophageal reflux disease) 02/27/2012    Past Surgical History:  Procedure Laterality Date  . ABDOMINAL HYSTERECTOMY  2005   complete  . COLONOSCOPY  03/15/2012   Procedure: COLONOSCOPY;  Surgeon: West Bali, MD;  Location: [facility];  Service: Endoscopy;  Laterality: N/A;  10:30 AM  . ESOPHAGOGASTRODUODENOSCOPY  03/15/2012   Procedure: ESOPHAGOGASTRODUODENOSCOPY (EGD);  Surgeon: West Bali, MD;  Location: [facility];  Service: Endoscopy;  Laterality: N/A;  . TONSILLECTOMY    . TUBAL LIGATION    . WISDOM TOOTH EXTRACTION       OB History   No obstetric history on file.      Home Medications    Prior to Admission medications   Medication Sig Start Date End Date Taking? Authorizing Provider  aspirin EC 81 MG tablet Take 81 mg by mouth daily.    [provider]  atorvastatin (LIPITOR) 80 MG tablet Take 80 mg by mouth daily.  [provider]  cyanocobalamin (,VITAMIN B-12,) 1000 MCG/ML injection Inject into the muscle.    [provider]  pantoprazole (PROTONIX) 40 MG tablet Take 40 mg by mouth every morning.    [provider]  topiramate (TOPAMAX) 50 MG tablet TAKE 1 TABLET BY MOUTH AT BEDTIME. 06/19/15   Drema Dallas, DO    Family History Family History  Problem Relation Age of Onset  . Colon cancer Maternal Grandfather 55  . Colonic polyp Son 9  . Hypertension Mother   . Thyroid cancer Mother   . Hypertension Father   . Ulcers Father        PUD  . Diabetes Father     Social History Social History   Tobacco Use  . Smoking status: Former Smoker    Packs/day: 1.00    Years: 10.00    Pack years: 10.00     Types: Cigarettes    Last attempt to quit: 12/04/2015    Years since quitting: 2.9  . Smokeless tobacco: Never Used  . Tobacco comment: patient is aware she needs to stop   Substance Use Topics  . Alcohol use: No    Alcohol/week: 0.0 standard drinks  . Drug use: No     Allergies   Patient has no known allergies.   Review of Systems Review of Systems  Constitutional: Negative for activity change.       All ROS Neg except as noted in HPI  HENT: Negative for nosebleeds.   Eyes: Negative for photophobia and discharge.  Respiratory: Negative for cough, shortness of breath and wheezing.   Cardiovascular: Negative for chest pain and palpitations.  Gastrointestinal: Positive for abdominal pain, constipation, diarrhea, nausea and vomiting. Negative for blood in stool.  Genitourinary: Negative for dysuria, frequency, hematuria, vaginal bleeding, vaginal discharge and vaginal pain.  Musculoskeletal: Negative for arthralgias, back pain and neck pain.  Skin: Negative.   Neurological: Negative for dizziness, seizures and speech difficulty.  Psychiatric/Behavioral: Negative for confusion and hallucinations.     Physical Exam Updated Vital Signs BP (!) 170/96 (BP Location: Right Arm)   Pulse 67   Temp 97.6 F (36.4 C) (Oral)   Resp 18   Ht  (1.626 m)   Wt 87.5 kg   SpO2 100%   BMI 33.13 kg/m   Physical Exam Vitals signs and nursing note reviewed.  Constitutional:      General: She is not in acute distress.    Appearance: She is well-developed.  HENT:     Head: Normocephalic and atraumatic.     Right Ear: External ear normal.     Left Ear: External ear normal.  Eyes:     General: No scleral icterus.       Right eye: No discharge.        Left eye: No discharge.     Conjunctiva/sclera: Conjunctivae normal.  Neck:     Musculoskeletal: Neck supple.     Trachea: No tracheal deviation.  Cardiovascular:     Rate and Rhythm: Normal rate and regular rhythm.  Pulmonary:      Effort: Pulmonary effort is normal. No respiratory distress.     Breath sounds: Normal breath sounds. No stridor. No wheezing or rales.  Abdominal:     General: Bowel sounds are normal. There is no distension.     Palpations: Abdomen is soft.     Tenderness: There is abdominal tenderness in the right upper quadrant and periumbilical area. There is no guarding or rebound.  Musculoskeletal:        General: No tenderness.     Lumbar back: She exhibits pain.     Comments: Right paraspinal pain radiating to the right flank.  Skin:    General: Skin is warm and dry.     Findings: No rash.  Neurological:     Mental Status: She is alert.     Cranial Nerves: No cranial nerve deficit (no facial droop, extraocular movements intact, no slurred speech).     Sensory: No sensory deficit.     Motor: No abnormal muscle tone or seizure activity.     Coordination: Coordination normal.      ED Treatments / Results  Labs (all labs ordered are listed, but only abnormal results are displayed) Labs Reviewed - No data to display  EKG None  Radiology No results found.  Procedures Procedures (including critical care time)  Medications Ordered in ED Medications - No data to display   Initial Impression / Assessment and Plan / ED Course  I have reviewed the triage vital signs and the nursing notes.  Pertinent labs & imaging results that were available during my care of the patient were reviewed by me and considered in my medical decision making (see chart for details).          Final Clinical Impressions(s) / ED Diagnoses MDM  Blood pressure elevated at 170/96. VS other wise wnl. PUlse ox 100%.  Patient is treated for her pain.  She is driving.  IV Toradol and Zofran given to the patient.  Complete blood count is well within normal limits.  Doubt systemic infection.  Comprehensive metabolic panel is nonacute.  Anion gap is normal at 7. Urinalysis shows a hazy yellow specimen with  specific gravity of 1.017.  There is a large amount of blood on the dipstick.  Nitrates are negative, leukocyte esterase is negative.  There is greater than 50 red blood cells.  6-10 white blood cells.  Rare bacteria.  There is also noted calcium oxalate crystals present.  CT scan of the abdomen has been ordered.  CT scan shows that there are 2 calculi in the right proximal ureter.  Both of these measure 0.5 but is 0.3 cm.  There is mild hydroureter present.  There are 2 punctate 1 to 2 mm left-sided renal calculi present.  Patient has been given a strainer and asked to strain all urine.  Have asked her to preserve the stones if she should pass them, and take them with her to her urology appointment with Dr. Ronne Binning.  The patient is to call the office and set up an appointment.  I discussed with her that this appointment may be a tele-doc appointment.  The patient is given a prescription for meloxicam and Percocet.  The patient advised to return to the emergency department if her pain persist, changes in her condition, problems, or concerns.   Final diagnoses:  Ureteral colic  Kidney stones    ED Discharge Orders         Ordered    oxyCODONE-acetaminophen (PERCOCET/ROXICET) 5-325 MG tablet  Every 6 hours PRN     11/03/18 1332    meloxicam (MOBIC) 15 MG tablet  Daily     11/03/18 1332    ondansetron (ZOFRAN) 4 MG tablet  Every 6 hours     11/03/18 1332           Ivery Quale, PA-C 11/03/18 1620    Geoffery Lyons, MD 11/08/18 1237

## 2018-11-03 NOTE — Discharge Instructions (Addendum)
Your urine test shows a small amount of blood in the urine.  Your CT scan confirms 2 small stones in the right ureter.  Please strain all urine over the next week.  If you should capture the stones, please collect them on a piece of tape and take them with you to the urology office.  Please increase fluids.  Lemonade may be helpful.  Please use meloxicam daily with a meal.  May use Percocet for severe pain. This medication may cause drowsiness. Please do not drink, drive, or participate in activity that requires concentration while taking this medication.

## 2018-11-03 NOTE — ED Triage Notes (Signed)
Pt presents to ED with complaints of right sided abdominal pain and back pain which started a few weeks ago. Pt denies N/V, diarrhea. Pt denies urinary symptoms.

## 2018-11-04 LAB — URINE CULTURE
Culture: 40000 — AB
Special Requests: NORMAL

## 2018-11-05 ENCOUNTER — Telehealth: Payer: Self-pay | Admitting: Emergency Medicine

## 2018-11-05 NOTE — Telephone Encounter (Signed)
Post ED Visit - Positive Culture Follow-up  Culture report reviewed by antimicrobial stewardship pharmacist: [facility] []  Enzo Bi, Pharm.D. []  Celedonio Miyamoto, Pharm.D., BCPS AQ-ID []  Garvin Fila, Pharm.D., BCPS []  Georgina Pillion, Pharm.D., BCPS []  [place], [place].D., BCPS, AAHIVP []  Estella Husk, Pharm.D., BCPS, AAHIVP [x]  Lysle Pearl, PharmD, BCPS []  Phillips Climes, PharmD, BCPS []  Agapito Games, PharmD, BCPS []  Verlan Friends, PharmD []  Mervyn Gay, PharmD, BCPS []  Vinnie Level, PharmD  [facility] []  Len Childs, PharmD []  Greer Pickerel, PharmD []  Adalberto Cole, PharmD []  Perlie Gold, Rph []  Lonell Face) Jean Rosenthal, PharmD []  Earl Many, PharmD []  Junita Push, PharmD []  Dorna Leitz, PharmD []  Terrilee Files, PharmD []  Lynann Beaver, PharmD []  Keturah Barre, PharmD []  Loralee Pacas, PharmD []  Bernadene Person, PharmD   Positive urine culture no further patient follow-up is required at this time.  Carollee Herter Haydyn Girvan 11/05/2018, 11:57 AM

## 2018-11-13 DIAGNOSIS — M722 Plantar fascial fibromatosis: Secondary | ICD-10-CM | POA: Diagnosis not present

## 2018-11-13 DIAGNOSIS — M25579 Pain in unspecified ankle and joints of unspecified foot: Secondary | ICD-10-CM | POA: Diagnosis not present

## 2018-11-13 DIAGNOSIS — M79671 Pain in right foot: Secondary | ICD-10-CM | POA: Diagnosis not present

## 2018-11-14 DIAGNOSIS — B951 Streptococcus, group B, as the cause of diseases classified elsewhere: Secondary | ICD-10-CM | POA: Diagnosis not present

## 2018-11-14 DIAGNOSIS — N390 Urinary tract infection, site not specified: Secondary | ICD-10-CM | POA: Diagnosis not present

## 2018-11-14 DIAGNOSIS — N202 Calculus of kidney with calculus of ureter: Secondary | ICD-10-CM | POA: Diagnosis not present

## 2018-11-16 ENCOUNTER — Other Ambulatory Visit: Payer: Self-pay | Admitting: Urology

## 2018-11-29 DIAGNOSIS — N201 Calculus of ureter: Secondary | ICD-10-CM | POA: Diagnosis not present

## 2018-11-30 ENCOUNTER — Other Ambulatory Visit: Payer: Self-pay

## 2018-11-30 DIAGNOSIS — I739 Peripheral vascular disease, unspecified: Secondary | ICD-10-CM

## 2018-12-01 ENCOUNTER — Telehealth (HOSPITAL_COMMUNITY): Payer: Self-pay | Admitting: Rehabilitation

## 2018-12-01 NOTE — Telephone Encounter (Signed)

## 2018-12-02 ENCOUNTER — Encounter (HOSPITAL_BASED_OUTPATIENT_CLINIC_OR_DEPARTMENT_OTHER): Payer: Self-pay

## 2018-12-04 ENCOUNTER — Ambulatory Visit (HOSPITAL_COMMUNITY)
Admission: RE | Admit: 2018-12-04 | Discharge: 2018-12-04 | Disposition: A | Discharge status: Home / Self Care | Payer: 59 | Source: Ambulatory Visit | Attending: Family | Admitting: Family

## 2018-12-04 ENCOUNTER — Encounter (HOSPITAL_BASED_OUTPATIENT_CLINIC_OR_DEPARTMENT_OTHER): Payer: Self-pay

## 2018-12-04 ENCOUNTER — Other Ambulatory Visit: Payer: Self-pay

## 2018-12-04 DIAGNOSIS — I739 Peripheral vascular disease, unspecified: Secondary | ICD-10-CM

## 2018-12-04 NOTE — Progress Notes (Signed)
SPOKE W/  Tobie     SCREENING SYMPTOMS OF COVID 19:   COUGH--NO  RUNNY NOSE--- NO  SORE THROAT---NO  NASAL CONGESTION----NO  SNEEZING----NO  SHORTNESS OF BREATH---NO  DIFFICULTY BREATHING---NO  TEMP >100.0 -----NO  UNEXPLAINED BODY ACHES------NO  CHILLS -------- NO  HEADACHES ---------NO  LOSS OF SMELL/ TASTE --------NO    HAVE YOU OR ANY FAMILY MEMBER TRAVELLED PAST 14 DAYS OUT OF THE   COUNTY---lives in [facility] [place] STATE----NO COUNTRY----NO  HAVE YOU OR ANY FAMILY MEMBER BEEN EXPOSED TO ANYONE WITH COVID 19? NO

## 2018-12-04 NOTE — Progress Notes (Signed)
Spoke with:  Chrissie NPO:  After Midnight, no gum, candy, or mints   Arrival time: [id number] Labs: KUB AM medications: None Pre op orders:  Yes Ride home:  [place] (Mom) [phone]

## 2018-12-04 NOTE — H&P (Signed)
HPI: Vanessa Lamb is a 43 year-old female patient with a right ureteral calculus.  The patient's stone was on her right side. She first noticed the symptoms 11/02/2018. This is her first kidney stone. There is not a history of calculus disease in the family. She is currently having flank pain, back pain, groin pain, nausea, and chills. She denies having vomiting and fever. She does have a burning sensation when she urinates. She has not caught a stone in her urine strainer since her symptoms began.   She has never had surgical treatment for calculi in the past. This condition would be considered of mild to moderate severity with no modifying factors or associated signs or symptoms other than as noted above.   11/14/18: The patient was seen in the ER on 11/03/18 with back and abdominal pain on the right-hand side that had been present since 03/20 but over the proceeding 24 hours had increased significantly. It was not relieved by positional change. A CT scan was obtained which revealed a 5 mm long, 2.5 mm wide stone in the proximal right ureter with very minute bilateral renal calculi noted as well. A urine culture was negative.  She has no prior history of stones. She has seen some hematuria that she describes as light pink urine. She also has noted some frequency and urgency but no dysuria.     ALLERGIES: No Allergies    MEDICATIONS: Aspirin PO Daily  Atorvastatin Calcium 40 mg tablet  Pantoprazole Sodium 1 PO Daily  Topiramate 50 mg tablet     GU PSH: Hysterectomy Unilat SO - 2012, 2008      PSH Notes: Dilation Of Female Urethra, Oral Surgery Tooth Extraction, Hysterectomy, Hysterectomy, Tonsillectomy   NON-GU PSH: Dental Surgery Procedure - 2012 Remove Tonsils - 2008    GU PMH: Dysuria, Dysuria - 2014 Renal calculus, Nephrolithiasis - 2014 Urinary Frequency, Urinary frequency - 2014 Urinary Urgency, Urinary urgency - 2014      PMH Notes:  1898-07-05 00:00:00 - Note: Normal Routine  History And Physical Adult   NON-GU PMH: Personal history of other diseases of the digestive system, History of esophageal reflux - 2014    FAMILY HISTORY: Blood In Urine - Mother Diabetes - Father Family Health Status - Father alive at age 42 - Runs In Family Family Health Status - Mother's Age - Runs In Family Family Health Status Number - Runs In Family Heart Disease - Father Laryngeal Cancer - Mother Nonfunctioning Kidney - Grandmother renal failure - Runs In Family   SOCIAL HISTORY: Marital Status: Single Preferred Language: English; Ethnicity: Not Hispanic Or Latino; Race: White Current Smoking Status: Patient does not smoke anymore. Smoked 1 pack per day.   Tobacco Use Assessment Completed: Used Tobacco in last 30 days? Has never drank.  Drinks 3 caffeinated drinks per day.     Notes: Current every day smoker, Caffeine Use, Alcohol Use, Occupation:, Marital History - Single, Tobacco Use, Occupation:, Caffeine Use   REVIEW OF SYSTEMS:    GU Review Female:   Patient reports frequent urination, hard to postpone urination, burning /pain with urination, get up at night to urinate, leakage of urine, stream starts and stops, trouble starting your stream, and have to strain to urinate. Patient denies being pregnant.  Gastrointestinal (Upper):   Patient reports nausea. Patient denies vomiting and indigestion/ heartburn.  Gastrointestinal (Lower):   Patient denies diarrhea and constipation.  Constitutional:   Patient reports fatigue. Patient denies fever, night sweats, and weight loss.  Skin:   Patient denies skin rash/ lesion and itching.  Eyes:   Patient denies blurred vision and double vision.  Ears/ Nose/ Throat:   Patient denies sore throat and sinus problems.  Hematologic/Lymphatic:   Patient denies swollen glands and easy bruising.  Cardiovascular:   Patient reports leg swelling. Patient denies chest pains.  Respiratory:   Patient denies cough and shortness of breath.   Endocrine:   Patient denies excessive thirst.  Musculoskeletal:   Patient reports back pain. Patient denies joint pain.  Neurological:   Patient reports headaches. Patient denies dizziness.  Psychologic:   Patient reports depression and anxiety.    VITAL SIGNS:    Weight 193 lb / 87.54 kg  Height 64 in / 162.56 cm  BP 119/79 mmHg  Pulse 78 /min  Temperature 97.8 F / 36.5 C  BMI 33.1 kg/m   MULTI-SYSTEM PHYSICAL EXAMINATION:    Constitutional: Well-nourished. No physical deformities. Normally developed. Good grooming.  Neck: Neck symmetrical, not swollen. Normal tracheal position.  Respiratory: No labored breathing, no use of accessory muscles.   Cardiovascular: Normal temperature, normal extremity pulses, no swelling, no varicosities.  Skin: No paleness, no jaundice, no cyanosis. No lesion, no ulcer, no rash.  Neurologic / Psychiatric: Oriented to time, oriented to place, oriented to person. No depression, no anxiety, no agitation.  Gastrointestinal: No mass, no tenderness, no rigidity, non obese abdomen.  Eyes: Normal conjunctivae. Normal eyelids.  Ears, Nose, Mouth, and Throat: Left ear no scars, no lesions, no masses. Right ear no scars, no lesions, no masses. Nose no scars, no lesions, no masses. Normal hearing. Normal lips.  Musculoskeletal: Normal gait and station of head and neck.     PAST DATA REVIEWED:  Source Of History:  Patient, Outside Source  Lab Test Review:   BUN/Creatinine, Calcium  Records Review:   Previous Hospital Records  Urine Test Review:   Urinalysis, Urine Culture  X-Ray Review: C.T. Abdomen/Pelvis: Reviewed Films. Reviewed Report.    Notes:                     On 11/03/18 her urinalysis did not appear infected and a culture was performed and found to be negative. Her creatinine was 0.88, her serum calcium was 9.2.   PROCEDURES:         KUB - Independent review of her KUB today reveals that the stones previously located in the proximal ureter now both  appear to be located in the lower pole of the right kidney. There are no stones along the course of the ureter that I can see.          Urinalysis w/Scope - 81001 Dipstick Dipstick Cont'd Micro  Color: Yellow Bilirubin: Neg WBC/hpf: 0 - 5/hpf  Appearance: Cloudy Ketones: Neg RBC/hpf: >60/hpf  Specific Gravity: 1.025 Blood: 3+ Bacteria: Mod (26-50/hpf)  pH: 5.5 Protein: Trace Cystals: Ca Oxalate  Glucose: Neg Urobilinogen: 0.2 Casts: Hyaline    Nitrites: Neg Trichomonas: Not Present    Leukocyte Esterase: 1+ Mucous: Present      Epithelial Cells: 0 - 5/hpf      Yeast: NS (Not Seen)      Sperm: Not Present     ASSESSMENT/PLAN:     ICD-10 Details  1 GU:   Ureteral calculus - N20.1 Right, Her right ureteral calculi appear to a fallen back into the kidney and are located in the lower pole. I do not see any stones along the ureter. After discussing the options  for management she has elected to proceed with ureteroscopy. She called saying that she had passed a stone and therefore came in for a KUB on 11/29/18.  The study revealed a density in the proximal right ureter.  2   Renal calculus - N20.0 Bilateral, We discussed the management of urinary stones. These options include observation, ureteroscopy, shockwave lithotripsy, and PCNL. We discussed which options are relevant to these particular stones. We discussed the natural history of stones as well as the complications of untreated stones and the impact on quality of life without treatment as well as with each of the above listed treatments. We also discussed the efficacy of each treatment in its ability to clear the stone burden. With any of these management options I discussed the signs and symptoms of infection and the need for emergent treatment should these be experienced. For each option we discussed the ability of each procedure to clear the patient of their stone burden.   For observation I described the risks which include but are not  limited to silent renal damage, life-threatening infection, need for emergent surgery, failure to pass stone, and pain.   For ureteroscopy I described the risks which include heart attack, stroke, pulmonary embolus, death, bleeding, infection, damage to contiguous structures, positioning injury, ureteral stricture, ureteral avulsion, ureteral injury, need for ureteral stent, inability to perform ureteroscopy, need for an interval procedure, inability to clear stone burden, stent discomfort and pain.   She has bilateral punctate renal calculi and now appears to have 2 stones in the lower pole of her right kidney that were in the proximal right ureter.

## 2018-12-05 ENCOUNTER — Other Ambulatory Visit (HOSPITAL_COMMUNITY)
Admission: RE | Admit: 2018-12-05 | Discharge: 2018-12-05 | Disposition: A | Discharge status: Home / Self Care | Payer: 59 | Source: Ambulatory Visit | Attending: Urology | Admitting: Urology

## 2018-12-05 DIAGNOSIS — Z1159 Encounter for screening for other viral diseases: Secondary | ICD-10-CM | POA: Diagnosis not present

## 2018-12-06 LAB — NOVEL CORONAVIRUS, NAA (HOSP ORDER, SEND-OUT TO REF LAB; TAT 18-24 HRS): SARS-CoV-2, NAA: NOT DETECTED

## 2018-12-07 NOTE — Progress Notes (Signed)
SPOKE W/  _Amy     SCREENING SYMPTOMS OF COVID 19:   COUGH--n  RUNNY NOSE--- n  SORE THROAT---n  NASAL CONGESTION----n  SNEEZING----n  SHORTNESS OF BREATH---n  DIFFICULTY BREATHING---n  TEMP >100.0 -----n  UNEXPLAINED BODY ACHES------n  CHILLS -------- n  HEADACHES ---------n  LOSS OF SMELL/ TASTE --------n Reminder to self quarantine   HAVE YOU OR ANY FAMILY MEMBER TRAVELLED PAST 14 DAYS OUT OF THE   COUNTY---n STATE----n COUNTRY----n  HAVE YOU OR ANY FAMILY MEMBER BEEN EXPOSED TO ANYONE WITH COVID 19? n

## 2018-12-07 NOTE — Anesthesia Preprocedure Evaluation (Addendum)
Anesthesia Evaluation  Patient identified by MRN, date of birth, ID band Patient awake    Reviewed: Allergy & Precautions, NPO status , Patient's Chart, lab work & pertinent test results  Airway Mallampati: II  TM Distance: >3 FB Neck ROM: Full    Dental no notable dental hx. (+) Teeth Intact   Pulmonary former smoker,    Pulmonary exam normal breath sounds clear to auscultation       Cardiovascular Exercise Tolerance: Good Normal cardiovascular exam Rhythm:Regular Rate:Normal     Neuro/Psych  Headaches, Depression    GI/Hepatic Neg liver ROS, hiatal hernia, GERD  ,  Endo/Other    Renal/GU negative Renal ROS     Musculoskeletal   Abdominal   Peds  Hematology  (+) anemia ,   Anesthesia Other Findings   Reproductive/Obstetrics                            Lab Results  Component Value Date   WBC 7.9 11/03/2018   HGB 13.6 11/03/2018   HCT 40.5 11/03/2018   MCV 96.0 11/03/2018   PLT 207 11/03/2018    Anesthesia Physical Anesthesia Plan  ASA: II  Anesthesia Plan: General   Post-op Pain Management:    Induction: Intravenous  PONV Risk Score and Plan: 4 or greater and Treatment may vary due to age or medical condition, Ondansetron and Dexamethasone  Airway Management Planned: LMA  Additional Equipment:   Intra-op Plan:   Post-operative Plan:   Informed Consent: I have reviewed the patients History and Physical, chart, labs and discussed the procedure including the risks, benefits and alternatives for the proposed anesthesia with the patient or authorized representative who has indicated his/her understanding and acceptance.     Dental advisory given  Plan Discussed with:   Anesthesia Plan Comments:        Anesthesia Quick Evaluation

## 2018-12-08 ENCOUNTER — Ambulatory Visit (HOSPITAL_COMMUNITY): Payer: 59

## 2018-12-08 ENCOUNTER — Ambulatory Visit (HOSPITAL_BASED_OUTPATIENT_CLINIC_OR_DEPARTMENT_OTHER): Payer: 59 | Admitting: Anesthesiology

## 2018-12-08 ENCOUNTER — Other Ambulatory Visit: Payer: Self-pay

## 2018-12-08 ENCOUNTER — Encounter (HOSPITAL_BASED_OUTPATIENT_CLINIC_OR_DEPARTMENT_OTHER): Payer: Self-pay | Admitting: Anesthesiology

## 2018-12-08 ENCOUNTER — Ambulatory Visit (HOSPITAL_BASED_OUTPATIENT_CLINIC_OR_DEPARTMENT_OTHER)
Admission: RE | Admit: 2018-12-08 | Discharge: 2018-12-08 | Disposition: A | Discharge status: Home / Self Care | Payer: 59 | Attending: Urology | Admitting: Urology

## 2018-12-08 ENCOUNTER — Encounter (HOSPITAL_BASED_OUTPATIENT_CLINIC_OR_DEPARTMENT_OTHER)
Admission: RE | Disposition: A | Discharge status: Home / Self Care | Payer: Self-pay | Source: Home / Self Care | Attending: Urology

## 2018-12-08 DIAGNOSIS — K449 Diaphragmatic hernia without obstruction or gangrene: Secondary | ICD-10-CM | POA: Insufficient documentation

## 2018-12-08 DIAGNOSIS — Z7982 Long term (current) use of aspirin: Secondary | ICD-10-CM | POA: Insufficient documentation

## 2018-12-08 DIAGNOSIS — F17210 Nicotine dependence, cigarettes, uncomplicated: Secondary | ICD-10-CM | POA: Insufficient documentation

## 2018-12-08 DIAGNOSIS — D649 Anemia, unspecified: Secondary | ICD-10-CM | POA: Diagnosis not present

## 2018-12-08 DIAGNOSIS — K219 Gastro-esophageal reflux disease without esophagitis: Secondary | ICD-10-CM | POA: Diagnosis not present

## 2018-12-08 DIAGNOSIS — N200 Calculus of kidney: Secondary | ICD-10-CM | POA: Insufficient documentation

## 2018-12-08 DIAGNOSIS — N201 Calculus of ureter: Secondary | ICD-10-CM | POA: Diagnosis not present

## 2018-12-08 DIAGNOSIS — Z79899 Other long term (current) drug therapy: Secondary | ICD-10-CM | POA: Insufficient documentation

## 2018-12-08 HISTORY — PX: URETEROSCOPY WITH HOLMIUM LASER LITHOTRIPSY: SHX6645

## 2018-12-08 HISTORY — DX: Unspecified ovarian cyst, left side: N83.202

## 2018-12-08 HISTORY — DX: Migraine, unspecified, not intractable, without status migrainosus: G43.909

## 2018-12-08 HISTORY — DX: Personal history of urinary calculi: Z87.442

## 2018-12-08 HISTORY — DX: Personal history of other diseases of the digestive system: Z87.19

## 2018-12-08 HISTORY — DX: Obesity, unspecified: E66.9

## 2018-12-08 HISTORY — DX: Depression, unspecified: F32.A

## 2018-12-08 HISTORY — DX: Atherosclerosis of aorta: I70.0

## 2018-12-08 HISTORY — DX: Facial weakness: R29.810

## 2018-12-08 HISTORY — DX: Vitamin B12 deficiency anemia due to intrinsic factor deficiency: D51.0

## 2018-12-08 HISTORY — DX: Occlusion and stenosis of unspecified carotid artery: I65.29

## 2018-12-08 HISTORY — DX: Esophagitis, unspecified without bleeding: K20.90

## 2018-12-08 HISTORY — DX: Weakness: R53.1

## 2018-12-08 HISTORY — DX: Personal history of other specified conditions: Z87.898

## 2018-12-08 HISTORY — DX: Palpitations: R00.2

## 2018-12-08 HISTORY — DX: Unspecified disorder of circulatory system: I99.9

## 2018-12-08 SURGERY — URETEROSCOPY, WITH LITHOTRIPSY USING HOLMIUM LASER
Anesthesia: General | Site: Ureter | Laterality: Right

## 2018-12-08 MED ORDER — PROPOFOL 10 MG/ML IV BOLUS
INTRAVENOUS | Status: DC | PRN
  Administered 2018-12-08: 170 mg via INTRAVENOUS

## 2018-12-08 MED ORDER — GABAPENTIN 100 MG PO CAPS
100.0000 mg | ORAL_CAPSULE | Freq: Once | ORAL | Status: DC
  Filled 2018-12-08: qty 1

## 2018-12-08 MED ORDER — FENTANYL CITRATE (PF) 100 MCG/2ML IJ SOLN
INTRAMUSCULAR | Status: DC | PRN
  Administered 2018-12-08 (×2): 50 ug via INTRAVENOUS

## 2018-12-08 MED ORDER — GABAPENTIN 300 MG PO CAPS
ORAL_CAPSULE | ORAL | Status: AC
  Filled 2018-12-08: qty 1

## 2018-12-08 MED ORDER — ONDANSETRON HCL 4 MG/2ML IJ SOLN
4.0000 mg | Freq: Once | INTRAMUSCULAR | Status: DC | PRN
  Filled 2018-12-08: qty 2

## 2018-12-08 MED ORDER — MIDAZOLAM HCL 2 MG/2ML IJ SOLN
INTRAMUSCULAR | Status: AC
  Filled 2018-12-08: qty 2

## 2018-12-08 MED ORDER — HYDROCODONE-ACETAMINOPHEN 7.5-325 MG PO TABS
1.0000 | ORAL_TABLET | Freq: Once | ORAL | Status: AC | PRN
  Administered 2018-12-08: 1 via ORAL
  Filled 2018-12-08: qty 1

## 2018-12-08 MED ORDER — KETOROLAC TROMETHAMINE 30 MG/ML IJ SOLN
INTRAMUSCULAR | Status: DC | PRN
  Administered 2018-12-08: 30 mg via INTRAVENOUS

## 2018-12-08 MED ORDER — CIPROFLOXACIN IN D5W 400 MG/200ML IV SOLN
400.0000 mg | Freq: Once | INTRAVENOUS | Status: AC
  Administered 2018-12-08: 400 mg via INTRAVENOUS
  Filled 2018-12-08: qty 200

## 2018-12-08 MED ORDER — IOHEXOL 300 MG/ML  SOLN
INTRAMUSCULAR | Status: DC | PRN
  Administered 2018-12-08: 2 mL

## 2018-12-08 MED ORDER — KETOROLAC TROMETHAMINE 30 MG/ML IJ SOLN
INTRAMUSCULAR | Status: AC
  Filled 2018-12-08: qty 1

## 2018-12-08 MED ORDER — KETOROLAC TROMETHAMINE 30 MG/ML IJ SOLN
30.0000 mg | Freq: Once | INTRAMUSCULAR | Status: DC | PRN
  Filled 2018-12-08: qty 1

## 2018-12-08 MED ORDER — GABAPENTIN 300 MG PO CAPS
300.0000 mg | ORAL_CAPSULE | Freq: Once | ORAL | Status: AC
  Administered 2018-12-08: 300 mg via ORAL
  Filled 2018-12-08: qty 1

## 2018-12-08 MED ORDER — CIPROFLOXACIN IN D5W 400 MG/200ML IV SOLN
INTRAVENOUS | Status: AC
  Filled 2018-12-08: qty 200

## 2018-12-08 MED ORDER — LACTATED RINGERS IV SOLN
INTRAVENOUS | Status: DC
  Administered 2018-12-08: 08:00:00 via INTRAVENOUS
  Filled 2018-12-08: qty 1000

## 2018-12-08 MED ORDER — MEPERIDINE HCL 25 MG/ML IJ SOLN
6.2500 mg | INTRAMUSCULAR | Status: DC | PRN
  Filled 2018-12-08: qty 1

## 2018-12-08 MED ORDER — HYDROCODONE-ACETAMINOPHEN 7.5-325 MG PO TABS
ORAL_TABLET | ORAL | Status: AC
  Filled 2018-12-08: qty 1

## 2018-12-08 MED ORDER — DEXAMETHASONE SODIUM PHOSPHATE 10 MG/ML IJ SOLN
INTRAMUSCULAR | Status: AC
  Filled 2018-12-08: qty 1

## 2018-12-08 MED ORDER — ACETAMINOPHEN 500 MG PO TABS
1000.0000 mg | ORAL_TABLET | Freq: Once | ORAL | Status: AC
  Administered 2018-12-08: 1000 mg via ORAL
  Filled 2018-12-08: qty 2

## 2018-12-08 MED ORDER — ONDANSETRON HCL 4 MG/2ML IJ SOLN
INTRAMUSCULAR | Status: DC | PRN
  Administered 2018-12-08: 4 mg via INTRAVENOUS

## 2018-12-08 MED ORDER — ONDANSETRON HCL 4 MG/2ML IJ SOLN
INTRAMUSCULAR | Status: AC
  Filled 2018-12-08: qty 2

## 2018-12-08 MED ORDER — LIDOCAINE 2% (20 MG/ML) 5 ML SYRINGE
INTRAMUSCULAR | Status: DC | PRN
  Administered 2018-12-08: 100 mg via INTRAVENOUS

## 2018-12-08 MED ORDER — DEXAMETHASONE SODIUM PHOSPHATE 10 MG/ML IJ SOLN
INTRAMUSCULAR | Status: DC | PRN
  Administered 2018-12-08: 5 mg via INTRAVENOUS

## 2018-12-08 MED ORDER — LIDOCAINE 2% (20 MG/ML) 5 ML SYRINGE
INTRAMUSCULAR | Status: AC
  Filled 2018-12-08: qty 5

## 2018-12-08 MED ORDER — SODIUM CHLORIDE 0.9 % IR SOLN
Status: DC | PRN
  Administered 2018-12-08: 3000 mL via INTRAVESICAL

## 2018-12-08 MED ORDER — FENTANYL CITRATE (PF) 100 MCG/2ML IJ SOLN
INTRAMUSCULAR | Status: AC
  Filled 2018-12-08: qty 2

## 2018-12-08 MED ORDER — ACETAMINOPHEN 500 MG PO TABS
ORAL_TABLET | ORAL | Status: AC
  Filled 2018-12-08: qty 2

## 2018-12-08 MED ORDER — MIDAZOLAM HCL 2 MG/2ML IJ SOLN
INTRAMUSCULAR | Status: DC | PRN
  Administered 2018-12-08: 2 mg via INTRAVENOUS

## 2018-12-08 MED ORDER — HYDROMORPHONE HCL 1 MG/ML IJ SOLN
0.2500 mg | INTRAMUSCULAR | Status: DC | PRN
  Filled 2018-12-08: qty 0.5

## 2018-12-08 MED ORDER — PROPOFOL 10 MG/ML IV BOLUS
INTRAVENOUS | Status: AC
  Filled 2018-12-08: qty 20

## 2018-12-08 SURGICAL SUPPLY — 23 items
BAG DRAIN URO-CYSTO SKYTR STRL (DRAIN) ×2 IMPLANT
BASKET LASER NITINOL 1.9FR (BASKET) ×1 IMPLANT
BASKET ZERO TIP NITINOL 2.4FR (BASKET) IMPLANT
CATH INTERMIT  6FR 70CM (CATHETERS) ×1 IMPLANT
CATH URET 5FR 28IN CONE TIP (BALLOONS)
CATH URET 5FR 70CM CONE TIP (BALLOONS) IMPLANT
CLOTH BEACON ORANGE TIMEOUT ST (SAFETY) ×2 IMPLANT
EXTRACTOR STONE 1.7FRX115CM (UROLOGICAL SUPPLIES) IMPLANT
FIBER LASER FLEXIVA 365 (UROLOGICAL SUPPLIES) IMPLANT
FIBER LASER TRAC TIP (UROLOGICAL SUPPLIES) IMPLANT
GLOVE BIO SURGEON STRL SZ8 (GLOVE) ×2 IMPLANT
GOWN STRL REUS W/TWL XL LVL3 (GOWN DISPOSABLE) ×2 IMPLANT
GUIDEWIRE ANG ZIPWIRE 038X150 (WIRE) IMPLANT
GUIDEWIRE STR DUAL SENSOR (WIRE) ×2 IMPLANT
IV NS IRRIG 3000ML ARTHROMATIC (IV SOLUTION) ×3 IMPLANT
KIT TURNOVER CYSTO (KITS) ×2 IMPLANT
MANIFOLD NEPTUNE II (INSTRUMENTS) ×1 IMPLANT
NS IRRIG 500ML POUR BTL (IV SOLUTION) ×2 IMPLANT
PACK CYSTO (CUSTOM PROCEDURE TRAY) ×2 IMPLANT
SHEATH URETERAL 12FRX28CM (UROLOGICAL SUPPLIES) ×1 IMPLANT
TUBE CONNECTING 12X1/4 (SUCTIONS) ×1 IMPLANT
TUBING UROLOGY SET (TUBING) ×1 IMPLANT
WATER STERILE IRR 3000ML UROMA (IV SOLUTION) IMPLANT

## 2018-12-08 NOTE — Op Note (Signed)
PATIENT:  Vanessa Lamb  PRE-OPERATIVE DIAGNOSIS:  right Ureteral calculus  POST-OPERATIVE DIAGNOSIS: Same  PROCEDURE:  1. Cystoscopy with right retrograde pyelogram including interpretation 2. Right ureteroscopy and stone extraction  SURGEON: Garnett Farm, MD  INDICATION: Vanessa Lamb is a 43 year old female with a right ureteral stone.  She had been on medical expulsive therapy and passed a small stone however repeat KUB revealed a stone continued to remain present in the right ureter and she has been having intermittent right-sided pain.  She is brought to the operating room today for ureteroscopic management of her right ureteral stone.  ANESTHESIA:  General  EBL:  Minimal  DRAINS: None  SPECIMEN: Stone given to patient  DESCRIPTION OF PROCEDURE: The patient was taken to the major OR and placed on the table. General anesthesia was administered and then the patient was moved to the dorsal lithotomy position. The genitalia was sterilely prepped and draped. An official timeout was performed.  Initially the 23 French cystoscope with 30 lens was passed under direct vision into the bladder. The bladder was then fully inspected. It was noted be free of any tumors, stones or inflammatory lesions. Ureteral orifices were of normal configuration and position. A 6 French open-ended ureteral catheter was then passed through the cystoscope into the ureteral orifice in order to perform a right retrograde pyelogram.  A retrograde pyelogram was performed by injecting full-strength contrast up the right ureter under direct fluoroscopic control. It revealed a filling defect in the mid right ureter consistent with the stone seen on the preoperative KUB. The remainder of the ureter was noted to be normal as was the intrarenal collecting system. I then passed a 0.038 inch floppy-tipped guidewire through the open ended catheter and into the area of the renal pelvis and this was left in place. The inner  portion of a ureteral access sheath was then passed over the guidewire to gently dilate the intramural ureter. I then proceeded with ureteroscopy.  A 4.5 French rigid ureteroscope was then passed under direct into the bladder and into the right orifice and up the ureter. The stone was identified and was noted to be elongated as noted on her previous imaging studies.  I was therefore able to pass a 0 tip nitinol basket through the ureteroscope and engage the stone maintaining its long axis parallel to the long axis of the ureter and was able to extract the stone without any resistance or difficulty.  The stone and the ureteroscope were removed.  The patient tolerated the procedure well no intraoperative complications.  PLAN OF CARE: Discharge to home after PACU  PATIENT DISPOSITION:  PACU - hemodynamically stable.

## 2018-12-08 NOTE — Anesthesia Postprocedure Evaluation (Signed)
Anesthesia Post Note  Patient: Vanessa Lamb  Procedure(s) Performed: URETEROSCOPY  STONE BASKETING (Right Ureter)     Anesthesia Type: General    Last Vitals:  Vitals:   12/08/18 1000 12/08/18 1105  BP: 102/64 117/69  Pulse: (!) 52 (!) 58  Resp: 16 16  Temp: 36.7 C 37.1 C  SpO2: 100% 100%    Last Pain:  Vitals:   12/08/18 1145  TempSrc:   PainSc: 4                  Trevor Iha

## 2018-12-08 NOTE — Anesthesia Procedure Notes (Signed)
Procedure Name: LMA Insertion Date/Time: 12/08/2018 8:36 AM Performed by: Tyrone Nine, CRNA Pre-anesthesia Checklist: Patient identified, Emergency Drugs available, Suction available and Patient being monitored Patient Re-evaluated:Patient Re-evaluated prior to induction Oxygen Delivery Method: Circle system utilized Preoxygenation: Pre-oxygenation with 100% oxygen Induction Type: IV induction Ventilation: Mask ventilation without difficulty LMA: LMA inserted LMA Size: 4.0 Number of attempts: 1 Placement Confirmation: CO2 detector,  breath sounds checked- equal and bilateral and positive ETCO2 Tube secured with: Tape Dental Injury: Teeth and Oropharynx as per pre-operative assessment

## 2018-12-08 NOTE — Transfer of Care (Signed)
Immediate Anesthesia Transfer of Care Note  Patient: Margert R Sedberry  Procedure(s) Performed: URETEROSCOPY  STONE BASKETING (Right Ureter)  Patient Location: PACU  Anesthesia Type:General  Level of Consciousness: awake, alert , oriented and patient cooperative  Airway & Oxygen Therapy: Patient Spontanous Breathing and Patient connected to nasal cannula oxygen  Post-op Assessment: Report given to RN and Post -op Vital signs reviewed and stable  Post vital signs: Reviewed and stable  Last Vitals:  Vitals Value Taken Time  BP    Temp    Pulse 69 12/08/2018  9:08 AM  Resp 12 12/08/2018  9:08 AM  SpO2 100 % 12/08/2018  9:08 AM  Vitals shown include unvalidated device data.  Last Pain:  Vitals:   12/08/18 0717  TempSrc: Oral      Patients Stated Pain Goal: 5 (12/08/18 0717)  Complications: No apparent anesthesia complications

## 2018-12-08 NOTE — Discharge Instructions (Signed)

## 2018-12-11 ENCOUNTER — Encounter (HOSPITAL_BASED_OUTPATIENT_CLINIC_OR_DEPARTMENT_OTHER): Payer: Self-pay | Admitting: Urology

## 2018-12-14 ENCOUNTER — Telehealth: Payer: Self-pay | Admitting: *Deleted

## 2018-12-14 NOTE — Telephone Encounter (Signed)
Virtual Visit Pre-Appointment Phone Call  Today, I spoke with Vanessa Lamb and performed the following actions:  1. I explained that we are currently trying to limit exposure to the COVID-19 virus by seeing patients at home rather than in the office.  I explained that the visits are best done by video, but can be done by telephone.  I asked the patient if a virtual visit that the patient would like to try instead of coming into the office. Vanessa Lamb agreed to proceed with the virtual visit scheduled with Dr. Lemar LivingsBrandon Cain on 12/15/18.          2. I confirmed the BEST phone number to call the day of the visit and- I included this in appointment notes.  3. I asked if the patient had access to (through a family member/friend) a smartphone with video capability to be used for her visit?"  The patient said yes -         If the patient said yes, I documented "VIDEO" in the appointment notes.  4. I confirmed consent by  a. sending through MyChart or by email the FULL LENGTH CONSENT FOR TELE-HEALTH VISIT as written at the end of this message or  b. verbally as listed below. i. This visit is being performed in the setting of COVID-19. ii. All virtual visits are billed to your insurance company just like a normal visit would be.   iii. We'd like you to understand that the technology does not allow for your provider to perform an examination, and thus may limit your provider's ability to fully assess your condition.  iv. If your provider identifies any concerns that need to be evaluated in person, we will make arrangements to do so.   v. Finally, though the technology is pretty good, we cannot assure that it will always work on either your or our end, and in the setting of a video visit, we may have to convert it to a phone-only visit.  In either situation, we cannot ensure that we have a secure connection.   vi. Are you willing to proceed?"  STAFF: Did the patient verbally acknowledge consent  to telehealth visit? Document YES/NO here: YES  2. I advised the patient to be prepared - I asked that the patient, on the day of her visit, record any information possible with the equipment at her home, such as blood pressure, pulse, oxygen saturation, and your weight and write them all down. I asked the patient to have a pen and paper handy nearby the day of the visit as well.  3. If the patient was scheduled for a video visit, I informed the patient that the visit with the doctor would start with a text to the smartphone # given to us by the patient.         If the patient was scheduled for a telephone call, I informed the patient that the visit with the doctor would start with a call to the telephone # given to us by the patient.  4. I Informed patient they will receive a phone call 15 minutes prior to their appointment time from a CMA or nurse to review medications, allergies, etc. to prepare for the visit.    TELEPHONE CALL NOTE  Vanessa Lamb has been deemed a candidate for a follow-up tele-health visit to limit community exposure during the Covid-19 pandemic. I spoke with the patient via phone to ensure availability of phone/video source, confirm  preferred email & phone number, and discuss instructions and expectations.  I reminded Vanessa Lamb to be prepared with any vital sign and/or heart rhythm information that could potentially be obtained via home monitoring, at the time of her visit. I reminded Vanessa Lamb to expect a phone call prior to her visit.  Cleaster Corin, NT 12/14/2018 12:33 PM     FULL LENGTH CONSENT FOR TELE-HEALTH VISIT   I hereby voluntarily request, consent and authorize [facility] and its employed or contracted physicians, physician assistants, nurse practitioners or other licensed health care professionals (the Practitioner), to provide me with telemedicine health care services (the "Services") as deemed necessary by the treating Practitioner. I  acknowledge and consent to receive the Services by the Practitioner via telemedicine. I understand that the telemedicine visit will involve communicating with the Practitioner through live audiovisual communication technology and the disclosure of certain medical information by electronic transmission. I acknowledge that I have been given the opportunity to request an in-person assessment or other available alternative prior to the telemedicine visit and am voluntarily participating in the telemedicine visit.  I understand that I have the right to withhold or withdraw my consent to the use of telemedicine in the course of my care at any time, without affecting my right to future care or treatment, and that the Practitioner or I may terminate the telemedicine visit at any time. I understand that I have the right to inspect all information obtained and/or recorded in the course of the telemedicine visit and may receive copies of available information for a reasonable fee.  I understand that some of the potential risks of receiving the Services via telemedicine include:  Marland Kitchen Delay or interruption in medical evaluation due to technological equipment failure or disruption; . Information transmitted may not be sufficient (e.g. poor resolution of images) to allow for appropriate medical decision making by the Practitioner; and/or  . In rare instances, security protocols could fail, causing a breach of personal health information.  Furthermore, I acknowledge that it is my responsibility to provide information about my medical history, conditions and care that is complete and accurate to the best of my ability. I acknowledge that Practitioner's advice, recommendations, and/or decision may be based on factors not within their control, such as incomplete or inaccurate data provided by me or distortions of diagnostic images or specimens that may result from electronic transmissions. I understand that the practice of  medicine is not an exact science and that Practitioner makes no warranties or guarantees regarding treatment outcomes. I acknowledge that I will receive a copy of this consent concurrently upon execution via email to the email address I last provided but may also request a printed copy by calling the office of [facility] [address].    I understand that my insurance will be billed for this visit.   I have read or had this consent read to me. . I understand the contents of this consent, which adequately explains the benefits and risks of the Services being provided via telemedicine.  . I have been provided ample opportunity to ask questions regarding this consent and the Services and have had my questions answered to my satisfaction. . I give my informed consent for the services to be provided through the use of telemedicine in my medical care  By participating in this telemedicine visit I agree to the above.

## 2018-12-15 ENCOUNTER — Ambulatory Visit (INDEPENDENT_AMBULATORY_CARE_PROVIDER_SITE_OTHER): Payer: 59 | Admitting: Vascular Surgery

## 2018-12-15 ENCOUNTER — Other Ambulatory Visit: Payer: Self-pay

## 2018-12-15 ENCOUNTER — Encounter: Payer: Self-pay | Admitting: Vascular Surgery

## 2018-12-15 DIAGNOSIS — I739 Peripheral vascular disease, unspecified: Secondary | ICD-10-CM

## 2018-12-15 NOTE — Progress Notes (Signed)
Virtual Visit via Video Note   I connected with Vanessa Lamb on 12/15/2018 using the [employer] virtual platform and verified that I was speaking with the correct person using two identifiers. Patient was located at at home and was alone and I am located at the office.   The limitations of evaluation and management by telemedicine and the availability of in person appointments have been previously discussed with the patient and are documented in the patients chart. The patient expressed understanding and consented to proceed.   PCP: Elfredia Nevins, MD  History of Present Illness: Vanessa Lamb is a 43 y.o. female whom I have previously seen for carotid artery stenosis.  I recently seen her for foot pain.  Today she presents with ABIs and has been followed by podiatry.  She denies any frank claudication.  She has never had stroke TIA or amaurosis.  ABIs were performed last week.  No tissue loss or ulceration.  She did quit smoking which I congratulated her for today.  Past Medical History:  Diagnosis Date  . Aortic atherosclerosis (HCC)   . Atrophic gastritis EGD SEP 2013  . B12 deficiency   . Carotid stenosis   . Depression   . Esophagitis    Mild  . Facial droop    Right  . GERD (gastroesophageal reflux disease)   . Heart palpitations   . History of chest pain   . History of hiatal hernia    Small  . History of kidney stones   . Hyperlipemia   . IBS (irritable bowel syndrome) TCS SEP 2013   NL COLON/DUO Bx  . Interstitial cystitis   . Left ovarian cyst   . Migraines   . Obese   . Pernicious anemia   . Right sided weakness   . Vascular disease     Past Surgical History:  Procedure Laterality Date  . ABDOMINAL HYSTERECTOMY  2005   complete  . COLONOSCOPY  03/15/2012   Procedure: COLONOSCOPY;  Surgeon: West Bali, MD;  Location: [facility];  Service: Endoscopy;  Laterality: N/A;  10:30 AM  . ESOPHAGOGASTRODUODENOSCOPY  03/15/2012   Procedure:  ESOPHAGOGASTRODUODENOSCOPY (EGD);  Surgeon: West Bali, MD;  Location: [facility];  Service: Endoscopy;  Laterality: N/A;  . TONSILLECTOMY    . TUBAL LIGATION    . URETEROSCOPY WITH HOLMIUM LASER LITHOTRIPSY Right 12/08/2018   Procedure: URETEROSCOPY  STONE BASKETING;  Surgeon: Ihor Gully, MD;  Location: [facility];  Service: Urology;  Laterality: Right;  . WISDOM TOOTH EXTRACTION      Current Meds  Medication Sig  . aspirin EC 81 MG tablet Take 81 mg by mouth daily.  Marland Kitchen atorvastatin (LIPITOR) 80 MG tablet Take 40 mg by mouth every evening.   . Cyanocobalamin (B-12) 1000 MCG SUBL Place 1 tablet under the tongue daily.  . pantoprazole (PROTONIX) 40 MG tablet Take 40 mg by mouth every evening.   . topiramate (TOPAMAX) 50 MG tablet TAKE 1 TABLET BY MOUTH AT BEDTIME.    12 system ROS was negative unless otherwise noted in HPI  Observations/Objective: There were no vitals filed for this visit.  Patient did not have blood pressure to take at home  She is awake alert and oriented She appears to be neurologically intact is moving all of her extremities without limitation  ABIs are triphasic and 1 bilaterally   Assessment and Plan: 43 year old female with history of carotid artery stenosis and has bilateral foot pain followed  by podiatry.  ABIs are are normal she should be okay for any podiatry intervention.  She desires to follow-up on a as needed basis.  Follow Up Instructions:  Follow up PRN   I discussed the assessment and treatment plan with the patient. The patient was provided an opportunity to ask questions and all were answered. The patient agreed with the plan and demonstrated an understanding of the instructions.   The patient was advised to call back or seek an in-person evaluation if the symptoms worsen or if the condition fails to improve as anticipated.  I spent 8 minutes with [name] via video encounter were all of her questions were answered.    Signed, Servando Snare [facility] Office: [phone]  12/15/2018, 3:29 PM

## 2019-02-09 ENCOUNTER — Ambulatory Visit: Payer: 59 | Admitting: Vascular Surgery

## 2019-02-09 ENCOUNTER — Encounter (HOSPITAL_COMMUNITY): Payer: 59

## 2019-04-13 ENCOUNTER — Other Ambulatory Visit: Payer: Self-pay | Admitting: Urology

## 2019-04-17 ENCOUNTER — Other Ambulatory Visit: Payer: Self-pay

## 2019-04-17 ENCOUNTER — Other Ambulatory Visit (HOSPITAL_COMMUNITY)
Admission: RE | Admit: 2019-04-17 | Discharge: 2019-04-17 | Disposition: A | Discharge status: Home / Self Care | Payer: 59 | Source: Ambulatory Visit | Attending: Urology | Admitting: Urology

## 2019-04-17 DIAGNOSIS — Z01812 Encounter for preprocedural laboratory examination: Secondary | ICD-10-CM | POA: Insufficient documentation

## 2019-04-17 DIAGNOSIS — Z20822 Contact with and (suspected) exposure to covid-19: Secondary | ICD-10-CM

## 2019-04-17 DIAGNOSIS — Z20828 Contact with and (suspected) exposure to other viral communicable diseases: Secondary | ICD-10-CM | POA: Insufficient documentation

## 2019-04-17 NOTE — Patient Instructions (Signed)
DUE TO COVID-19 ONLY ONE VISITOR IS ALLOWED TO COME WITH YOU AND STAY IN THE WAITING ROOM ONLY DURING PRE OP AND PROCEDURE DAY OF SURGERY. THE 1 VISITOR MAY VISIT WITH YOU AFTER SURGERY IN YOUR PRIVATE ROOM DURING VISITING HOURS ONLY!  YOU NEED TO HAVE A COVID 19 TEST ON_______ @_______ , THIS TEST MUST BE DONE BEFORE SURGERY, COME  [address], [place] , .  [facility]) ONCE YOUR COVID TEST IS COMPLETED, PLEASE BEGIN THE QUARANTINE INSTRUCTIONS AS OUTLINED IN YOUR HANDOUT.                Vanessa Lamb  04/17/2019   Your procedure is scheduled on: 04-20-19   Report to [facility] Main  Entrance   Report to admitting at 0845 AM     Call this number if you have problems the morning of surgery [phone]    Remember: Do not eat food or drink liquids :After Midnight . BRUSH YOUR TEETH MORNING OF SURGERY AND RINSE YOUR MOUTH OUT, NO CHEWING GUM CANDY OR MINTS.     Take these medicines the morning of surgery with A SIP OF WATER: hydrocodone if needed                                 You may not have any metal on your body including hair pins and              piercings  Do not wear jewelry, make-up, lotions, powders or perfumes, deodorant             Do not wear nail polish on your fingernails.  Do not shave  48 hours prior to surgery.              Do not bring valuables to the hospital. [facility] IS NOT             RESPONSIBLE   FOR VALUABLES.  Contacts, dentures or bridgework may not be worn into surgery.      Patients discharged the day of surgery will not be allowed to drive home. IF YOU ARE HAVING SURGERY AND GOING HOME THE SAME DAY, YOU MUST HAVE AN ADULT TO DRIVE YOU HOME AND BE WITH YOU FOR 24 HOURS. YOU MAY GO HOME BY TAXI OR UBER OR ORTHERWISE, BUT AN ADULT MUST ACCOMPANY YOU HOME AND STAY WITH YOU FOR 24 HOURS.  Name and phone number of your driver:  Special Instructions: N/A              Please read over the following fact  sheets you were given: _____________________________________________________________________           [facility] - Preparing for Surgery Before surgery, you can play an important role.  Because skin is not sterile, your skin needs to be as free of germs as possible.  You can reduce the number of germs on your skin by washing with CHG (chlorahexidine gluconate) soap before surgery.  CHG is an antiseptic cleaner which kills germs and bonds with the skin to continue killing germs even after washing. Please DO NOT use if you have an allergy to CHG or antibacterial soaps.  If your skin becomes reddened/irritated stop using the CHG and inform your nurse when you arrive at Short Stay. Do not shave (including legs and underarms) for at least 48 hours prior to the first CHG shower.  You may shave  your face/neck. Please follow these instructions carefully:  1.  Shower with CHG Soap the night before surgery and the  morning of Surgery.  2.  If you choose to wash your hair, wash your hair first as usual with your  normal  shampoo.  3.  After you shampoo, rinse your hair and body thoroughly to remove the  shampoo.                           4.  Use CHG as you would any other liquid soap.  You can apply chg directly  to the skin and wash                       Gently with a scrungie or clean washcloth.  5.  Apply the CHG Soap to your body ONLY FROM THE NECK DOWN.   Do not use on face/ open                           Wound or open sores. Avoid contact with eyes, ears mouth and genitals (private parts).                       Wash face,  Genitals (private parts) with your normal soap.             6.  Wash thoroughly, paying special attention to the area where your surgery  will be performed.  7.  Thoroughly rinse your body with warm water from the neck down.  8.  DO NOT shower/wash with your normal soap after using and rinsing off  the CHG Soap.                9.  Pat yourself dry with a clean towel.             10.  Wear clean pajamas.            11.  Place clean sheets on your bed the night of your first shower and do not  sleep with pets. Day of Surgery : Do not apply any lotions/deodorants the morning of surgery.  Please wear clean clothes to the hospital/surgery center.  FAILURE TO FOLLOW THESE INSTRUCTIONS MAY RESULT IN THE CANCELLATION OF YOUR SURGERY PATIENT SIGNATURE_________________________________  NURSE SIGNATURE__________________________________  ________________________________________________________________________

## 2019-04-17 NOTE — Addendum Note (Signed)
Addended by: Jonelle Sidle E on: 04/17/2019 12:13 PM   Modules accepted: Orders

## 2019-04-18 ENCOUNTER — Encounter (HOSPITAL_COMMUNITY)
Admission: RE | Admit: 2019-04-18 | Discharge: 2019-04-18 | Disposition: A | Discharge status: Home / Self Care | Payer: 59 | Source: Ambulatory Visit | Attending: Urology | Admitting: Urology

## 2019-04-18 ENCOUNTER — Encounter (HOSPITAL_COMMUNITY): Payer: Self-pay

## 2019-04-18 ENCOUNTER — Other Ambulatory Visit: Payer: Self-pay

## 2019-04-18 DIAGNOSIS — N200 Calculus of kidney: Secondary | ICD-10-CM | POA: Insufficient documentation

## 2019-04-18 DIAGNOSIS — Z01812 Encounter for preprocedural laboratory examination: Secondary | ICD-10-CM | POA: Diagnosis not present

## 2019-04-18 LAB — CBC
HCT: 42.9 % (ref 36.0–46.0)
Hemoglobin: 14 g/dL (ref 12.0–15.0)
MCH: 32.6 pg (ref 26.0–34.0)
MCHC: 32.6 g/dL (ref 30.0–36.0)
MCV: 100 fL (ref 80.0–100.0)
Platelets: 255 10*3/uL (ref 150–400)
RBC: 4.29 MIL/uL (ref 3.87–5.11)
RDW: 13.1 % (ref 11.5–15.5)
WBC: 9.1 10*3/uL (ref 4.0–10.5)
nRBC: 0 % (ref 0.0–0.2)

## 2019-04-18 LAB — NOVEL CORONAVIRUS, NAA (HOSP ORDER, SEND-OUT TO REF LAB; TAT 18-24 HRS): SARS-CoV-2, NAA: NOT DETECTED

## 2019-04-18 NOTE — Progress Notes (Signed)
SPOKE W/  _ Eleesha     SCREENING SYMPTOMS OF COVID 19:   COUGH--NO  RUNNY NOSE--- NO  SORE THROAT---NO  NASAL CONGESTION----NO  SNEEZING----NO  SHORTNESS OF BREATH---NO  DIFFICULTY BREATHING---NO  TEMP >100.0 -----NO  UNEXPLAINED BODY ACHES------NO  CHILLS -------- NO  HEADACHES ---------NO  LOSS OF SMELL/ TASTE --------NO    HAVE YOU OR ANY FAMILY MEMBER TRAVELLED PAST 14 DAYS OUT OF THE   COUNTY---YES STATE----NO COUNTRY----NO  HAVE YOU OR ANY FAMILY MEMBER BEEN EXPOSED TO ANYONE WITH COVID 19? NO

## 2019-04-18 NOTE — Progress Notes (Signed)
PCP - Dr. Redmond School Cardiologist - n/a  Chest x-ray - n/a EKG - n/a Stress Test - n/a ECHO - n/a Cardiac Cath - n/a  Sleep Study - n/a CPAP - n/a  Fasting Blood Sugar -n/a  Checks Blood Sugar _____ times a day  Blood Thinner Instructions:none Aspirin Instructions: Last Dose:  Anesthesia review: n/a  Patient denies shortness of breath, fever, cough and chest pain at PAT appointment   Patient verbalized understanding of instructions that were given to them at the PAT appointment. Patient was also instructed that they will need to review over the PAT instructions again at home before surgery.

## 2019-04-20 ENCOUNTER — Encounter (HOSPITAL_COMMUNITY)
Admission: RE | Disposition: A | Discharge status: Home / Self Care | Payer: Self-pay | Source: Home / Self Care | Attending: Urology

## 2019-04-20 ENCOUNTER — Ambulatory Visit (HOSPITAL_COMMUNITY): Payer: 59 | Admitting: Anesthesiology

## 2019-04-20 ENCOUNTER — Other Ambulatory Visit: Payer: Self-pay

## 2019-04-20 ENCOUNTER — Ambulatory Visit (HOSPITAL_COMMUNITY)
Admission: RE | Admit: 2019-04-20 | Discharge: 2019-04-20 | Disposition: A | Discharge status: Home / Self Care | Payer: 59 | Attending: Urology | Admitting: Urology

## 2019-04-20 ENCOUNTER — Encounter (HOSPITAL_COMMUNITY): Payer: Self-pay | Admitting: Emergency Medicine

## 2019-04-20 ENCOUNTER — Ambulatory Visit (HOSPITAL_COMMUNITY): Payer: 59

## 2019-04-20 DIAGNOSIS — Z79899 Other long term (current) drug therapy: Secondary | ICD-10-CM | POA: Insufficient documentation

## 2019-04-20 DIAGNOSIS — Z7982 Long term (current) use of aspirin: Secondary | ICD-10-CM | POA: Insufficient documentation

## 2019-04-20 DIAGNOSIS — K449 Diaphragmatic hernia without obstruction or gangrene: Secondary | ICD-10-CM | POA: Insufficient documentation

## 2019-04-20 DIAGNOSIS — N201 Calculus of ureter: Secondary | ICD-10-CM

## 2019-04-20 DIAGNOSIS — E785 Hyperlipidemia, unspecified: Secondary | ICD-10-CM | POA: Diagnosis not present

## 2019-04-20 DIAGNOSIS — K219 Gastro-esophageal reflux disease without esophagitis: Secondary | ICD-10-CM | POA: Diagnosis not present

## 2019-04-20 DIAGNOSIS — Z87891 Personal history of nicotine dependence: Secondary | ICD-10-CM | POA: Insufficient documentation

## 2019-04-20 HISTORY — PX: CYSTOSCOPY/URETEROSCOPY/HOLMIUM LASER/STENT PLACEMENT: SHX6546

## 2019-04-20 SURGERY — CYSTOSCOPY/URETEROSCOPY/HOLMIUM LASER/STENT PLACEMENT
Anesthesia: General | Laterality: Right

## 2019-04-20 MED ORDER — LIDOCAINE 2% (20 MG/ML) 5 ML SYRINGE
INTRAMUSCULAR | Status: AC
  Filled 2019-04-20: qty 5

## 2019-04-20 MED ORDER — SODIUM CHLORIDE 0.9 % IR SOLN
Status: DC | PRN
  Administered 2019-04-20: 3000 mL via INTRAVESICAL

## 2019-04-20 MED ORDER — LIDOCAINE 2% (20 MG/ML) 5 ML SYRINGE
INTRAMUSCULAR | Status: DC | PRN
  Administered 2019-04-20: 75 mg via INTRAVENOUS

## 2019-04-20 MED ORDER — MIDAZOLAM HCL 2 MG/2ML IJ SOLN
INTRAMUSCULAR | Status: AC
  Filled 2019-04-20: qty 2

## 2019-04-20 MED ORDER — ONDANSETRON HCL 4 MG/2ML IJ SOLN
INTRAMUSCULAR | Status: DC | PRN
  Administered 2019-04-20: 4 mg via INTRAVENOUS

## 2019-04-20 MED ORDER — ONDANSETRON HCL 4 MG/2ML IJ SOLN
INTRAMUSCULAR | Status: AC
  Filled 2019-04-20: qty 2

## 2019-04-20 MED ORDER — DEXAMETHASONE SODIUM PHOSPHATE 10 MG/ML IJ SOLN
INTRAMUSCULAR | Status: AC
  Filled 2019-04-20: qty 1

## 2019-04-20 MED ORDER — FENTANYL CITRATE (PF) 100 MCG/2ML IJ SOLN: 25.0000 ug | INTRAMUSCULAR | Status: DC | PRN

## 2019-04-20 MED ORDER — MIDAZOLAM HCL 5 MG/5ML IJ SOLN
INTRAMUSCULAR | Status: DC | PRN
  Administered 2019-04-20: 2 mg via INTRAVENOUS

## 2019-04-20 MED ORDER — LACTATED RINGERS IV SOLN
INTRAVENOUS | Status: DC
  Administered 2019-04-20: 09:00:00 via INTRAVENOUS

## 2019-04-20 MED ORDER — FENTANYL CITRATE (PF) 100 MCG/2ML IJ SOLN
INTRAMUSCULAR | Status: DC | PRN
  Administered 2019-04-20: 50 ug via INTRAVENOUS
  Administered 2019-04-20 (×2): 25 ug via INTRAVENOUS

## 2019-04-20 MED ORDER — CIPROFLOXACIN IN D5W 400 MG/200ML IV SOLN
400.0000 mg | Freq: Once | INTRAVENOUS | Status: AC
  Administered 2019-04-20: 400 mg via INTRAVENOUS
  Filled 2019-04-20: qty 200

## 2019-04-20 MED ORDER — ACETAMINOPHEN 500 MG PO TABS
1000.0000 mg | ORAL_TABLET | Freq: Once | ORAL | Status: AC
  Administered 2019-04-20: 1000 mg via ORAL
  Filled 2019-04-20: qty 2

## 2019-04-20 MED ORDER — PROPOFOL 10 MG/ML IV BOLUS
INTRAVENOUS | Status: DC | PRN
  Administered 2019-04-20: 200 mg via INTRAVENOUS

## 2019-04-20 MED ORDER — PHENAZOPYRIDINE HCL 200 MG PO TABS: 200.0000 mg | ORAL_TABLET | Freq: Three times a day (TID) | ORAL | 0 refills | Status: DC | PRN

## 2019-04-20 MED ORDER — DEXAMETHASONE SODIUM PHOSPHATE 10 MG/ML IJ SOLN
INTRAMUSCULAR | Status: DC | PRN
  Administered 2019-04-20: 10 mg via INTRAVENOUS

## 2019-04-20 MED ORDER — PROPOFOL 10 MG/ML IV BOLUS
INTRAVENOUS | Status: AC
  Filled 2019-04-20: qty 20

## 2019-04-20 MED ORDER — FENTANYL CITRATE (PF) 100 MCG/2ML IJ SOLN
INTRAMUSCULAR | Status: AC
  Filled 2019-04-20: qty 2

## 2019-04-20 MED ORDER — 0.9 % SODIUM CHLORIDE (POUR BTL) OPTIME
TOPICAL | Status: DC | PRN
  Administered 2019-04-20: 10:00:00 1000 mL

## 2019-04-20 MED ORDER — IOHEXOL 300 MG/ML  SOLN
INTRAMUSCULAR | Status: DC | PRN
  Administered 2019-04-20: 10 mL via URETHRAL

## 2019-04-20 SURGICAL SUPPLY — 19 items
BAG URO CATCHER STRL LF (MISCELLANEOUS) ×2 IMPLANT
BASKET LASER NITINOL 1.9FR (BASKET) ×2 IMPLANT
BASKET ZERO TIP NITINOL 2.4FR (BASKET) IMPLANT
CATH INTERMIT  6FR 70CM (CATHETERS) ×2 IMPLANT
CLOTH BEACON ORANGE TIMEOUT ST (SAFETY) IMPLANT
EXTRACTOR STONE 1.7FRX115CM (UROLOGICAL SUPPLIES) IMPLANT
GLOVE BIOGEL M 8.0 STRL (GLOVE) ×2 IMPLANT
GOWN STRL REUS W/TWL XL LVL3 (GOWN DISPOSABLE) ×2 IMPLANT
GUIDEWIRE ANG ZIPWIRE 038X150 (WIRE) IMPLANT
GUIDEWIRE STR DUAL SENSOR (WIRE) ×2 IMPLANT
IV NS 1000ML (IV SOLUTION)
IV NS 1000ML BAXH (IV SOLUTION) IMPLANT
KIT TURNOVER KIT A (KITS) ×2 IMPLANT
MANIFOLD NEPTUNE II (INSTRUMENTS) ×2 IMPLANT
PACK CYSTO (CUSTOM PROCEDURE TRAY) ×2 IMPLANT
SHEATH URETERAL 12FRX28CM (UROLOGICAL SUPPLIES) IMPLANT
SHEATH URETERAL 12FRX35CM (MISCELLANEOUS) ×2 IMPLANT
TUBING CONNECTING 10 (TUBING) ×2 IMPLANT
TUBING UROLOGY SET (TUBING) ×2 IMPLANT

## 2019-04-20 NOTE — Op Note (Signed)
PATIENT:  Vanessa Lamb  PRE-OPERATIVE DIAGNOSIS:  right Ureteral calculus  POST-OPERATIVE DIAGNOSIS: Same  PROCEDURE:  1. Cystoscopy with right retrograde pyelogram including interpretation. 2.  Right ureteroscopy with stone basketing.  SURGEON: Claybon Jabs, MD  INDICATION: [name] is a 43 year old female who has a right ureteral stone and has undergone medical expulsive therapy but her stone has failed to progress.  She is intermittently symptomatic and we therefore discussed the options for management and she has elected to proceed with ureteroscopy.  ANESTHESIA:  General  EBL:  Minimal  DRAINS: None  SPECIMEN: Stone given to patient  DESCRIPTION OF PROCEDURE: The patient was taken to the major OR and placed on the table. General anesthesia was administered and then the patient was moved to the dorsal lithotomy position. The genitalia was sterilely prepped and draped. An official timeout was performed.  Initially the 61 French cystoscope with 30 lens was passed under direct vision into the bladder. The bladder was then fully inspected. It was noted be free of any tumors, stones or inflammatory lesions. Ureteral orifices were of normal configuration and position. A 6 French open-ended ureteral catheter was then passed through the cystoscope into the ureteral orifice in order to perform a right retrograde pyelogram.  A retrograde pyelogram was performed by injecting full-strength contrast up the right ureter under direct fluoroscopic control. It revealed a filling defect in the mid ureter consistent with the stone seen on the preoperative KUB. The remainder of the ureter was noted to be normal as was the intrarenal collecting system. I then passed a 0.038 inch floppy-tipped guidewire through the open ended catheter and into the area of the renal pelvis and this was left in place. The inner portion of a ureteral access sheath was then passed over the guidewire to gently dilate the  intramural ureter. I then proceeded with ureteroscopy.  A 6 [place] 4 [place] tapered rigid ureteroscope was then passed under direct into the bladder and into the right orifice and up the ureter. The stone was identified and was noted to be elongated in shape.  It was felt to be small enough to be removed with a basket so I passed and engage basket through the ureteroscope, engaged the stone such that its long axis was parallel with the ureter and pulled this up against the beak of the ureteroscope.  I slowly backed the scope out of the ureter and met no resistance.  I then removed the guidewire and because her procedure proceeded without any difficulty it was felt there was no need for stent placement.  I reinserted the cystoscope sheath with obturator and use this to drain the bladder after which the cystoscope sheath was removed.  The patient tolerated the procedure well no intraoperative complications.  PLAN OF CARE: Discharge to home after PACU  PATIENT DISPOSITION:  PACU - hemodynamically stable.

## 2019-04-20 NOTE — Discharge Instructions (Signed)

## 2019-04-20 NOTE — Anesthesia Postprocedure Evaluation (Signed)
Anesthesia Post Note  Patient: Vanessa Lamb  Procedure(s) Performed: CYSTOSCOPY/RETROGRADE/URETEROSCOPY/STONE BASKETRY (Right )     Patient location during evaluation: PACU Anesthesia Type: General Level of consciousness: awake and alert Pain management: pain level controlled Vital Signs Assessment: post-procedure vital signs reviewed and stable Respiratory status: spontaneous breathing, nonlabored ventilation, respiratory function stable and patient connected to nasal cannula oxygen Cardiovascular status: blood pressure returned to baseline and stable Postop Assessment: no apparent nausea or vomiting Anesthetic complications: no    Last Vitals:  Vitals:   04/20/19 1030 04/20/19 1039  BP: 110/75 (!) 118/91  Pulse: 68 63  Resp: 19 16  Temp: 36.8 C 36.6 C  SpO2: 95% 99%    Last Pain:  Vitals:   04/20/19 1039  TempSrc: Oral  PainSc:                  [name] [name]

## 2019-04-20 NOTE — Anesthesia Procedure Notes (Signed)
Procedure Name: LMA Insertion Date/Time: 04/20/2019 9:32 AM Performed by: Lollie Sails, CRNA Pre-anesthesia Checklist: Patient identified, Emergency Drugs available, Suction available, Patient being monitored and Timeout performed Patient Re-evaluated:Patient Re-evaluated prior to induction Oxygen Delivery Method: Circle system utilized Preoxygenation: Pre-oxygenation with 100% oxygen Induction Type: IV induction Ventilation: Mask ventilation without difficulty LMA: LMA inserted LMA Size: 4.0 Number of attempts: 1 Placement Confirmation: positive ETCO2 and breath sounds checked- equal and bilateral Tube secured with: Tape Dental Injury: Teeth and Oropharynx as per pre-operative assessment

## 2019-04-20 NOTE — H&P (Signed)
HPI: Vanessa Lamb is a 43 year-old female with a right ureteral calculus.  04/05/19: Pt of Dr. Vernie Ammons presents for acute right flank pain which started yesterday. She voided a lot yesterday. She drank more fluid. No fever. No dysuria. Pain better today. She has shingles on the left. KUB today with possible 5x3 mm right proximal stone.   She had rt prox stone in May 2020 and rt URS. F/u US normal. She brought the stone with her for analysis. She noted red urine / hematuria last month.   04/11/19: Patient with above noted history. There was a questionable opacity in the proximal right ureter on prior KUB imaging. Today she denies seeing a stone pass. She denies any current or interval episodes of right flank or abdominal pain. She has noted some increased urinary incontinence, but otherwise feels she is voiding at baseline. She denies dysuria, gross hematuria, fevers, chills, nausea, or vomiting.   The problem is on the right side. Her pain started about approximately 04/04/2019. The intensity of her pain is rated as a 8. The pain is intermittent.     ALLERGIES: No Allergies    MEDICATIONS: Aspirin PO Daily  Tamsulosin Hcl 0.4 mg capsule 1 capsule PO Q HS  Atorvastatin Calcium 40 mg tablet  Pantoprazole Sodium 1 PO Daily     GU PSH: Hysterectomy Unilat SO - 2012, 2008 Ureteroscopic stone removal, Right - 12/08/2018       PSH Notes: Dilation Of Female Urethra, Oral Surgery Tooth Extraction, Hysterectomy, Hysterectomy, Tonsillectomy   NON-GU PSH: Dental Surgery Procedure - 2012 Remove Tonsils - 2008     GU PMH: Flank Pain - 04/05/2019 Gross hematuria - 04/05/2019 Ureteral calculus - 01/30/2019, - 12/20/2018, Right, Her right ureteral calculi appear to a fallen back into the kidney and are located in the lower pole. I do not see any stones along the ureter. After discussing the options for management she has elected to proceed with ureteroscopy, - 11/14/2018 Renal calculus, Bilateral, She has  bilateral punctate renal calculi and now appears to have 2 stones in the lower pole of her right kidney that were in the proximal right ureter. - 11/14/2018, Nephrolithiasis, - 2014 Dysuria, Dysuria - 2014 Urinary Frequency, Urinary frequency - 2014 Urinary Urgency, Urinary urgency - 2014      PMH Notes:  1898-07-05 00:00:00 - Note: Normal Routine History And Physical Adult   NON-GU PMH: Bacteriuria, Although she had a previous negative culture she did have bacteria in the urine today and frequency and urgency so her urine will be cultured in preparation for surgery. - 11/14/2018 Personal history of other diseases of the digestive system, History of esophageal reflux - 2014    FAMILY HISTORY: Blood In Urine - Mother Diabetes - Father Family Health Status - Father alive at age 77 - Runs In Family Family Health Status - Mother's Age - Runs In Family Family Health Status Number - Runs In Family Heart Disease - Father Laryngeal Cancer - Mother Nonfunctioning Kidney - Grandmother renal failure - Runs In Family   SOCIAL HISTORY: Marital Status: Single Preferred Language: English; Ethnicity: Not Hispanic Or Latino; Race: White Current Smoking Status: Patient does not smoke anymore. Smoked 1 pack per day.   Tobacco Use Assessment Completed: Used Tobacco in last 30 days? Has never drank.  Drinks 3 caffeinated drinks per day.     Notes: Current every day smoker, Caffeine Use, Alcohol Use, Occupation:, Marital History - Single, Tobacco Use, Occupation:, Caffeine Use   REVIEW  OF SYSTEMS:    GU Review Female:   Patient reports frequent urination, hard to postpone urination, get up at night to urinate, and stream starts and stops. Patient denies burning /pain with urination, leakage of urine, trouble starting your stream, have to strain to urinate, and being pregnant.  Gastrointestinal (Upper):   Patient denies nausea, vomiting, and indigestion/ heartburn.  Gastrointestinal (Lower):   Patient  denies diarrhea and constipation.  Constitutional:   Patient reports night sweats and fatigue. Patient denies fever and weight loss.  Skin:   Patient denies skin rash/ lesion and itching.  Eyes:   Patient denies blurred vision and double vision.  Ears/ Nose/ Throat:   Patient denies sore throat and sinus problems.  Hematologic/Lymphatic:   Patient denies swollen glands and easy bruising.  Cardiovascular:   Patient denies leg swelling and chest pains.  Respiratory:   Patient denies cough and shortness of breath.  Endocrine:   Patient denies excessive thirst.  Musculoskeletal:   Patient denies back pain and joint pain.  Neurological:   Patient reports headaches. Patient denies dizziness.  Psychologic:   Patient reports anxiety. Patient denies depression.   VITAL SIGNS:    Weight 190 lb / 86.18 kg  Height 64 in / 162.56 cm  BP 123/87 mmHg  Pulse 81 /min  Temperature 98.4 F / 36.8 C  BMI 32.6 kg/m   MULTI-SYSTEM PHYSICAL EXAMINATION:    Constitutional: Well-nourished. No physical deformities. Normally developed. Good grooming.  Respiratory: No labored breathing, no use of accessory muscles.   Cardiovascular: Normal temperature, normal extremity pulses, no swelling, no varicosities.  Neurologic / Psychiatric: Oriented to time, oriented to place, oriented to person. No depression, no anxiety, no agitation.  Gastrointestinal: No mass, no tenderness, no rigidity, non obese abdomen. No CVAT.   Musculoskeletal: Normal gait and station of head and neck.     PAST DATA REVIEWED:  Source Of History:  Patient  Records Review:   Previous Patient Records  Urine Test Review:   Urinalysis, Urine Culture  X-Ray Review: KUB: Reviewed Films.     PROCEDURES:         KUB - [id number]  A single view of the abdomen is obtained. No obvious opacities noted within the confines of bilateral renal shadows. Along the expected anatomical course of the right ureter, at the level of the transverse process of L4,  there continues to be an approximatly 3 X 6 mm opacity concerning for calculus. No opacities noted along the expected anatomical course of the left ureter. This has progressed minimally when compared to past KUB. Clips noted in the pelvis. Normal appearance of overlying bowel gas pattern.           Renal Ultrasound - [id number]  Right Kidney: Length:11.64 cm Depth: 4.79 cm Cortical Width: 1.25 cm Width: 4.93 cm  Left Kidney: Length: 12.16 cm Depth: 5.70 cm Cortical Width: 1.09 cm Width: 5.42 cm  Left Kidney/Ureter:  1)Possible Upper Pole Cystic Area measuring 1.32cm x 1.21cm x 1.44cm-----2)? Upper Pole Stones measuring 0.73cm and 0.41cm and ? Mid Pole Stone measuring 0.60cm----3) Several Subcentimeter hyperechoic areas with and without shadowing - ? stones vs vascular calcifications   Right Kidney/Ureter:  1)Appearance of Mild to moderate Hydro with Dilated Renal Pelvis measuring 0.87cm and Dilated Proximal Ureter measuring 0.53cm------2)Lower Pole Cystic Area measuring 0.98cm x 0.66cm x 0.75cm-----3) Several Subcentimeter hyperechoic areas with and without shadowing largest in Lower pole measuring 0.41cm and ? 0.61cm Lower Pole Stone- ? stones vs vascular calcifications  Bladder:  PVR = 41.28ml          Urinalysis Dipstick Dipstick Cont'd  Color: Straw Bilirubin: Neg mg/dL  Appearance: Clear Ketones: Neg mg/dL  Specific Gravity: <=1.005 Blood: Neg ery/uL  pH: 6.5 Protein: Neg mg/dL  Glucose: Neg mg/dL Urobilinogen: 0.2 mg/dL    Nitrites: Neg    Leukocyte Esterase: Neg leu/uL    ASSESSMENT/PLAN:   Ureteral calculus, right             Document Letter(s):  Created for Patient: Clinical Summary         Notes:   UA today is without infectious parameters or hematuria. On KUB imaging today, along the expected anatomical course of the right ureter, at the level of the transverse process of L4, there continues to be an approximatly 3 X 6 mm opacity concerning for calculus. No opacities noted  along the expected anatomical course of the left ureter. RUS does show right hydronephrosis, as well. Overall, she has been doing well since being seen last with relatively minimal symptoms. We reviewed treatment options in detail today including: continued observation, ESWL, or URS. She has elected to proceed with ureteroscopy.

## 2019-04-20 NOTE — Transfer of Care (Signed)
Immediate Anesthesia Transfer of Care Note  Patient: Vanessa Lamb  Procedure(s) Performed: CYSTOSCOPY/RETROGRADE/URETEROSCOPY/STONE BASKETRY (Right )  Patient Location: PACU  Anesthesia Type:General  Level of Consciousness: awake, alert  and patient cooperative  Airway & Oxygen Therapy: Patient Spontanous Breathing and Patient connected to face mask oxygen  Post-op Assessment: Report given to RN and Post -op Vital signs reviewed and stable  Post vital signs: Reviewed and stable  Last Vitals:  Vitals Value Taken Time  BP    Temp 36.8 C 04/20/19 1005  Pulse 64 04/20/19 1006  Resp 15 04/20/19 1006  SpO2 100 % 04/20/19 1006  Vitals shown include unvalidated device data.  Last Pain:  Vitals:   04/20/19 0847  TempSrc:   PainSc: 0-No pain      Patients Stated Pain Goal: 4 (55/73/22 0254)  Complications: No apparent anesthesia complications

## 2019-04-20 NOTE — Anesthesia Preprocedure Evaluation (Addendum)
Anesthesia Evaluation  Patient identified by MRN, date of birth, ID band Patient awake    Reviewed: Allergy & Precautions, NPO status , Patient's Chart, lab work & pertinent test results  Airway Mallampati: III  TM Distance: >3 FB Neck ROM: Full    Dental no notable dental hx. (+) Teeth Intact, Dental Advisory Given,    Pulmonary neg pulmonary ROS, former smoker,    Pulmonary exam normal breath sounds clear to auscultation       Cardiovascular Normal cardiovascular exam Rhythm:Regular Rate:Normal  HLD  Carotid Doppler 2020 Right Carotid: Velocities in the right ICA are consistent with a 1-39% stenosis, low end of range. Left Carotid: Velocities in the left ICA are consistent with a 1-39% stenosis, low end of range. Vertebrals:  Bilateral vertebral arteries demonstrate antegrade flow. Subclavians: Normal flow hemodynamics were seen in bilateral subclavian arteries.  Stress Test 2017 There was no ST segment deviation noted during stress. Nonspecific T wave abnormalities with stress. No arrhythmias. Good exercise tolerance with low risk Duke treadmill score portends a favorable prognosis with respect to cardiovascular events   Neuro/Psych  Headaches, PSYCHIATRIC DISORDERS Depression    GI/Hepatic Neg liver ROS, hiatal hernia, GERD  Medicated and Controlled,  Endo/Other  negative endocrine ROS  Renal/GU negative Renal ROS  negative genitourinary   Musculoskeletal negative musculoskeletal ROS (+)   Abdominal   Peds  Hematology negative hematology ROS (+)   Anesthesia Other Findings Right ureteral stone  Reproductive/Obstetrics                            Anesthesia Physical Anesthesia Plan  ASA: II  Anesthesia Plan: General   Post-op Pain Management:    Induction: Intravenous  PONV Risk Score and Plan: 3 and Ondansetron, Dexamethasone and Midazolam  Airway Management Planned:  LMA  Additional Equipment:   Intra-op Plan:   Post-operative Plan: Extubation in OR  Informed Consent: I have reviewed the patients History and Physical, chart, labs and discussed the procedure including the risks, benefits and alternatives for the proposed anesthesia with the patient or authorized representative who has indicated his/her understanding and acceptance.     Dental advisory given  Plan Discussed with: CRNA  Anesthesia Plan Comments:         Anesthesia Quick Evaluation

## 2019-04-21 ENCOUNTER — Encounter (HOSPITAL_COMMUNITY): Payer: Self-pay | Admitting: Urology

## 2020-01-13 IMAGING — DX ABDOMEN - 1 VIEW
2 series · 2 of 2 positions shown · non-contrast
Comparison: 11/29/2018.  11/14/2018.  CT 11/03/2018.

CLINICAL DATA: Right-sided stone.

EXAM:
ABDOMEN - 1 VIEW

[abdomen kub (1 of 2)]
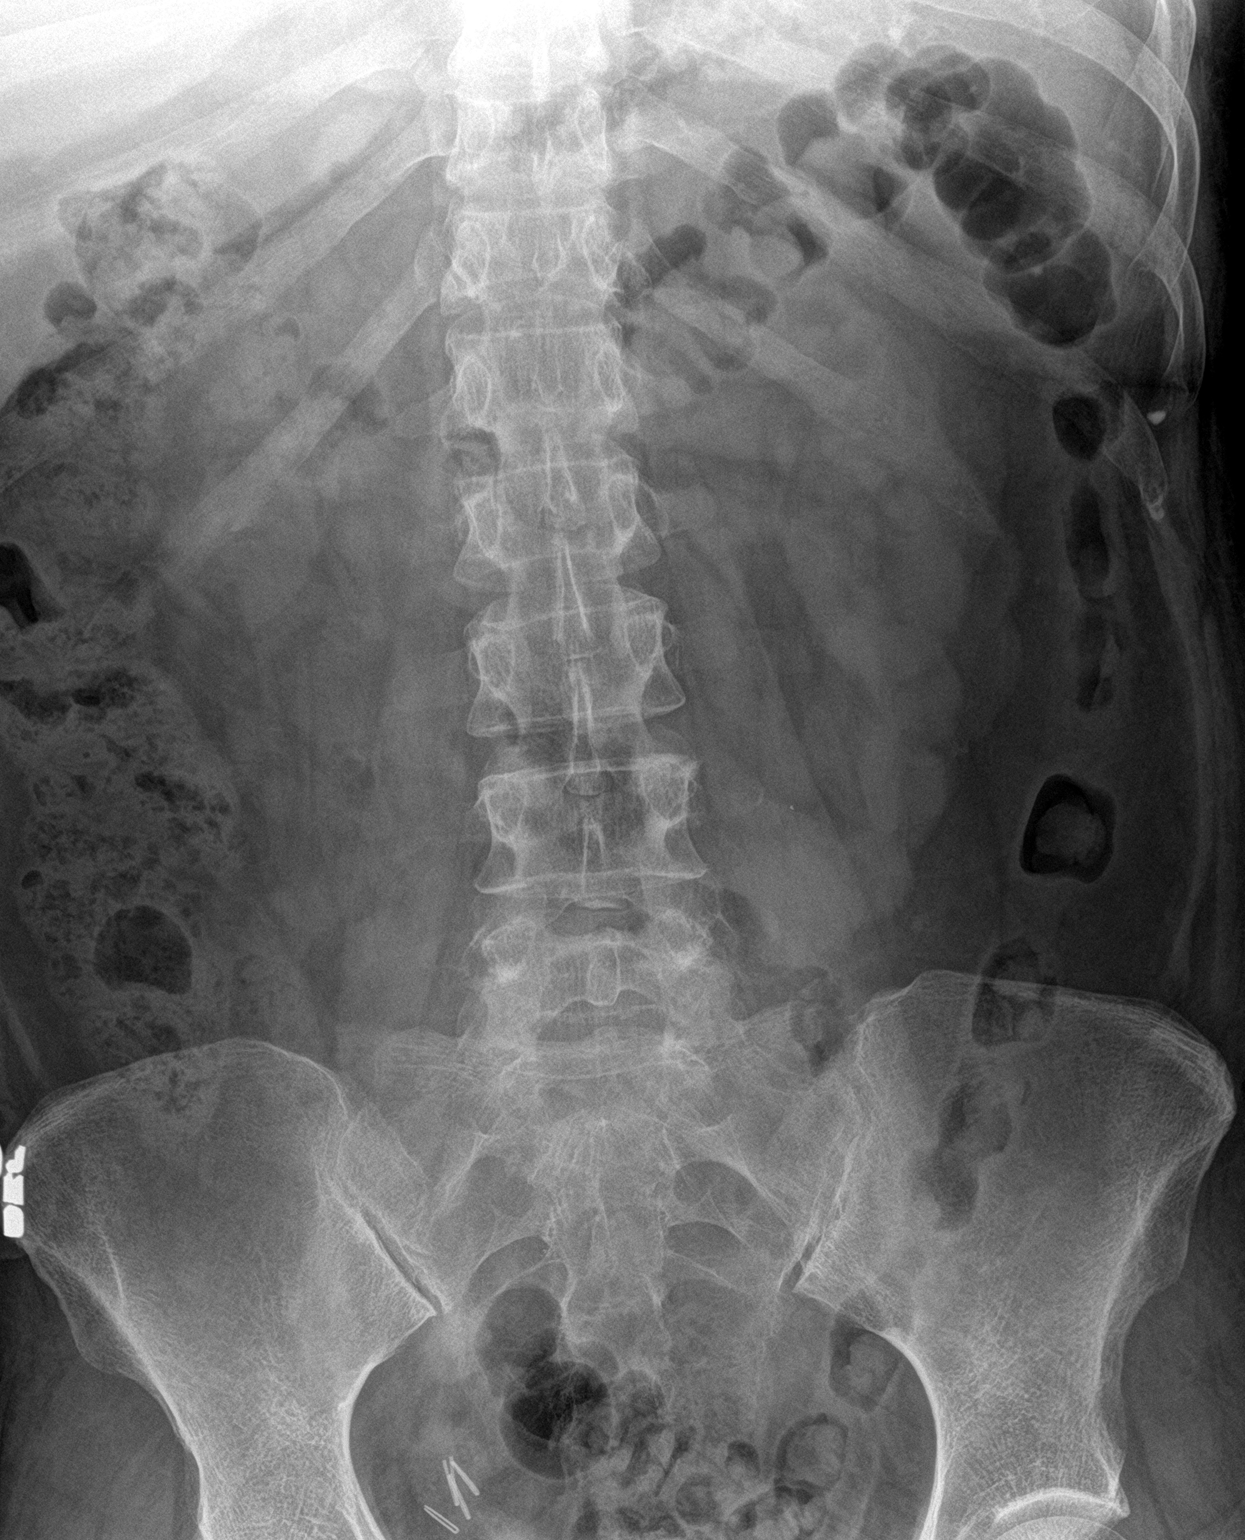

[abdomen kub (2 of 2)]
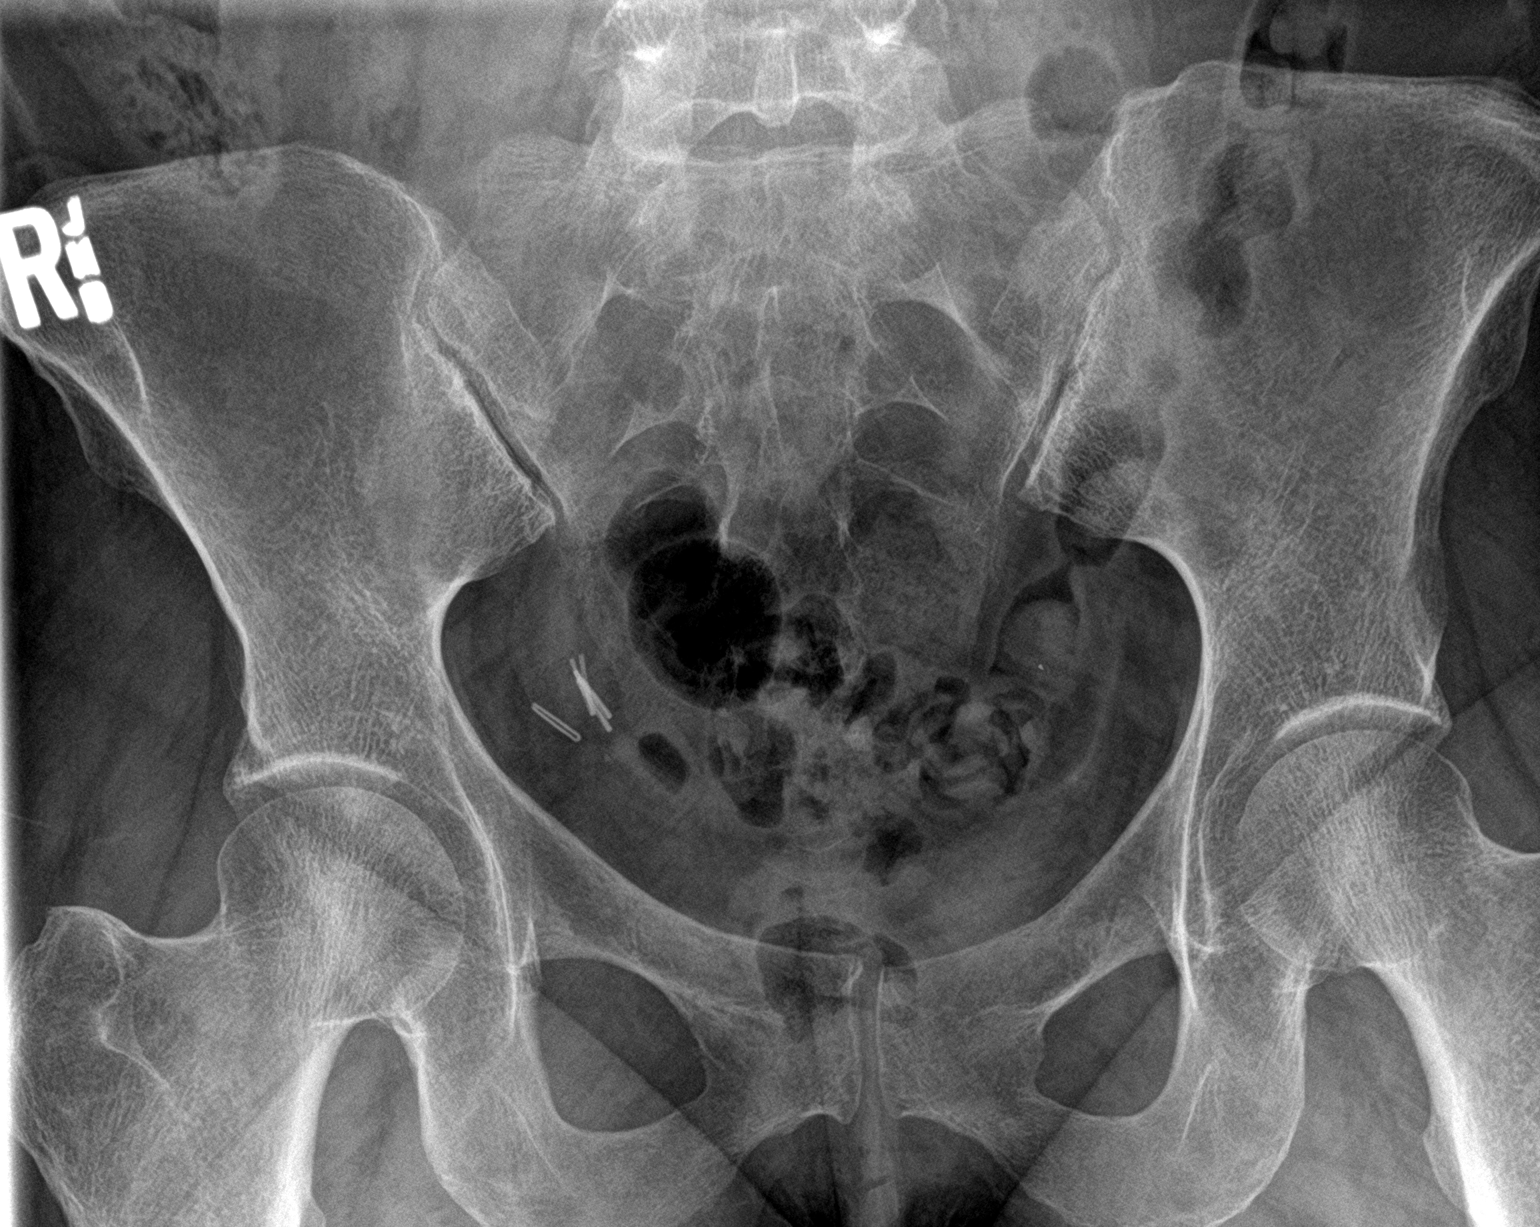

[2 of 2 positions shown; findings below may reference images not displayed]

FINDINGS: Soft tissue structures are unremarkable. No bowel distention. Stool
noted throughout the colon. No free air. Tiny 3 mm right renal stone
cannot be excluded. Tiny 2 mm calcific density noted the right lower
pelvis. This may represent a tiny phlebolith. Distal right ureteral
stone cannot be excluded. Previously identified right upper ureteral
stones on CT of 11/03/2018 no longer identified. Evaluation for
stone disease be difficult due to overlying stool. Pelvic clips are
noted. No acute bony abnormality.
IMPRESSION: 1. Tiny 3 mm right renal stone cannot be excluded. Tiny 2 mm
calcific density noted the right lower pelvis. A tiny distal right
ureteral stone fragment cannot be completely excluded. Previously
identified right upper ureteral stones on CT of 11/03/2018 no longer
identified. Evaluation for stone disease difficult due to overlying
stool.

2. No bowel distention. Prominent amount of stool noted throughout
the colon.

## 2020-05-25 IMAGING — CR DG ABDOMEN 1V
2 series · 2 of 2 positions shown · non-contrast
Comparison: CT abdomen and pelvis-11/03/2018

CLINICAL DATA: Preoperative examination for right-sided
nephrolithiasis.

EXAM:
ABDOMEN - 1 VIEW

[t abdomen supine (1 of 2)]
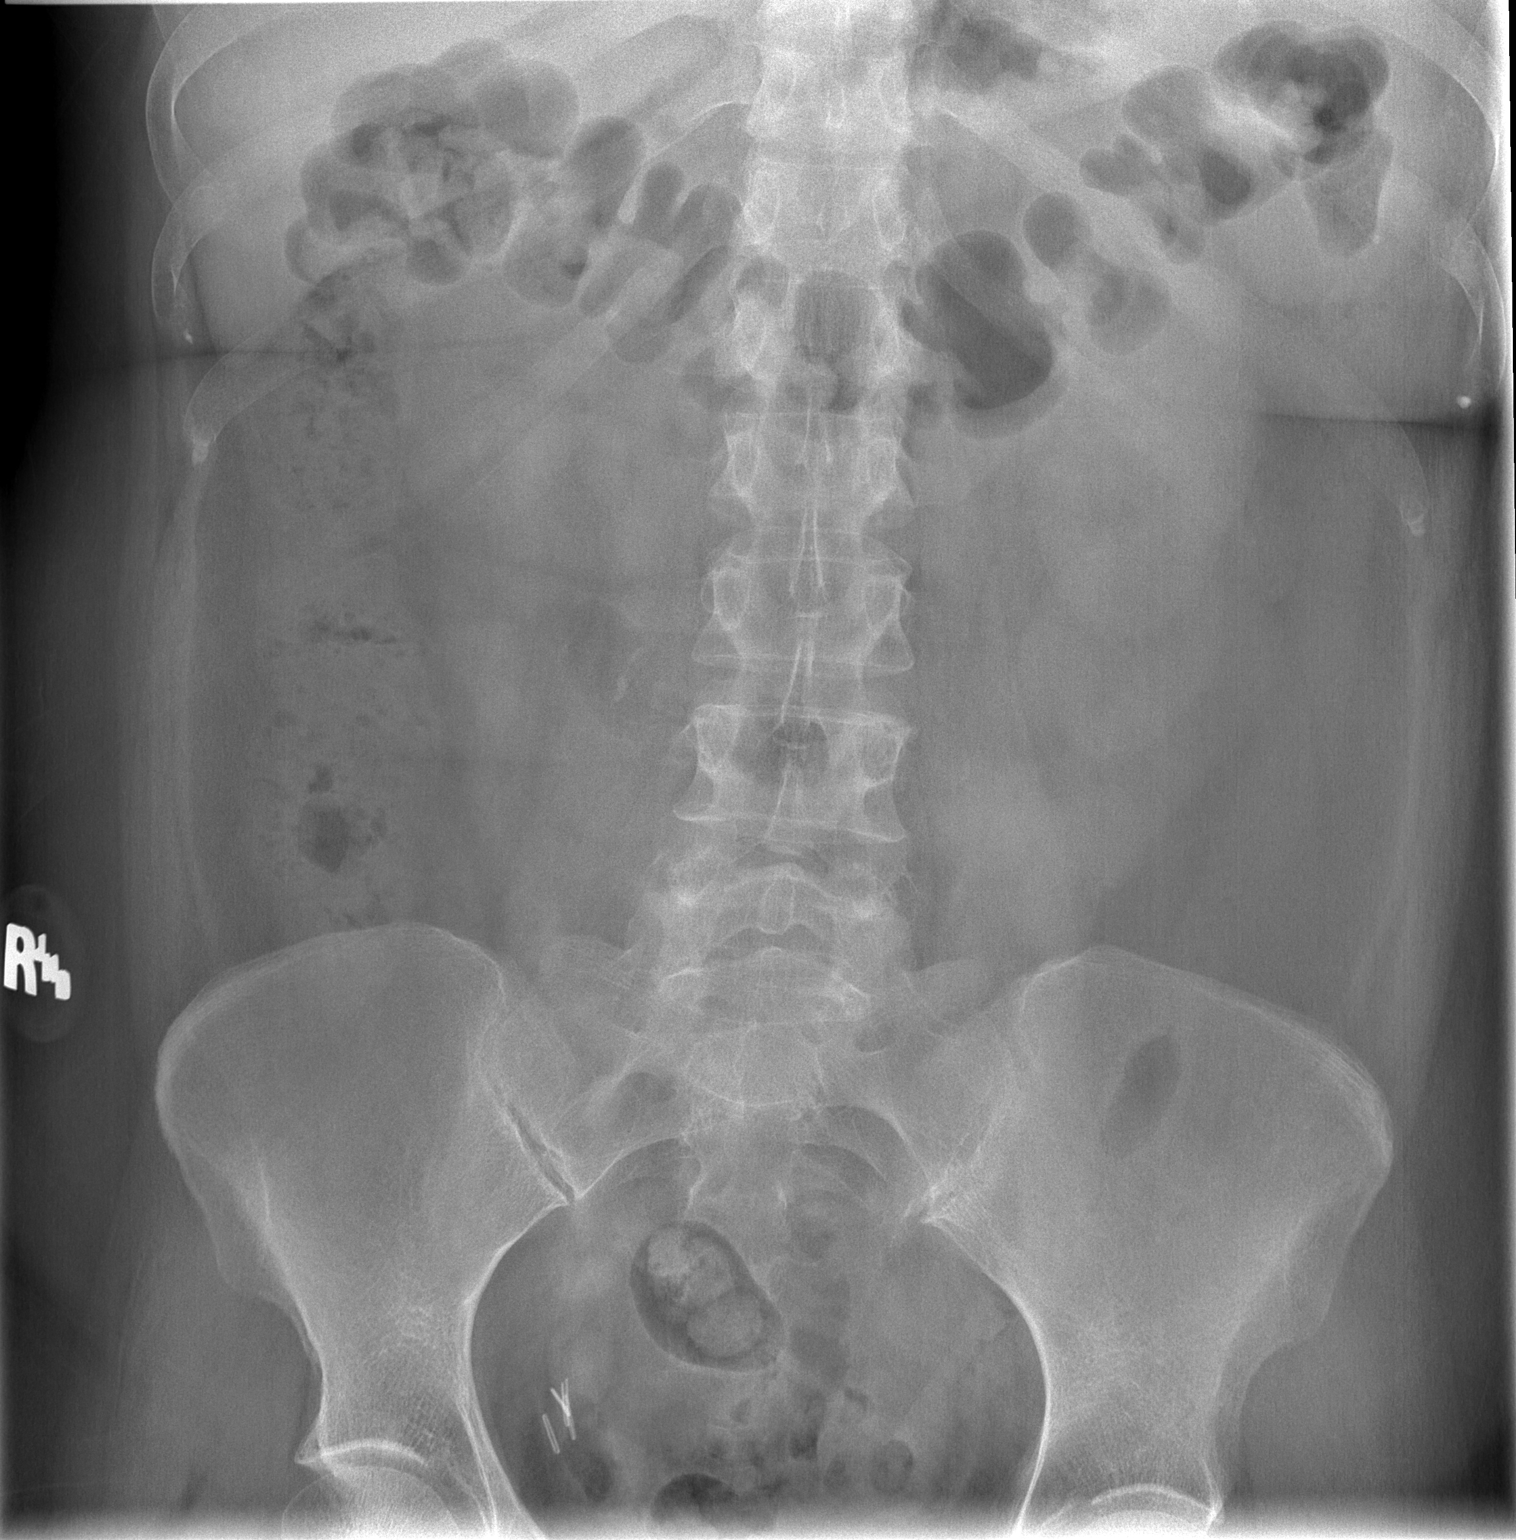

[t abdomen supine (2 of 2)]
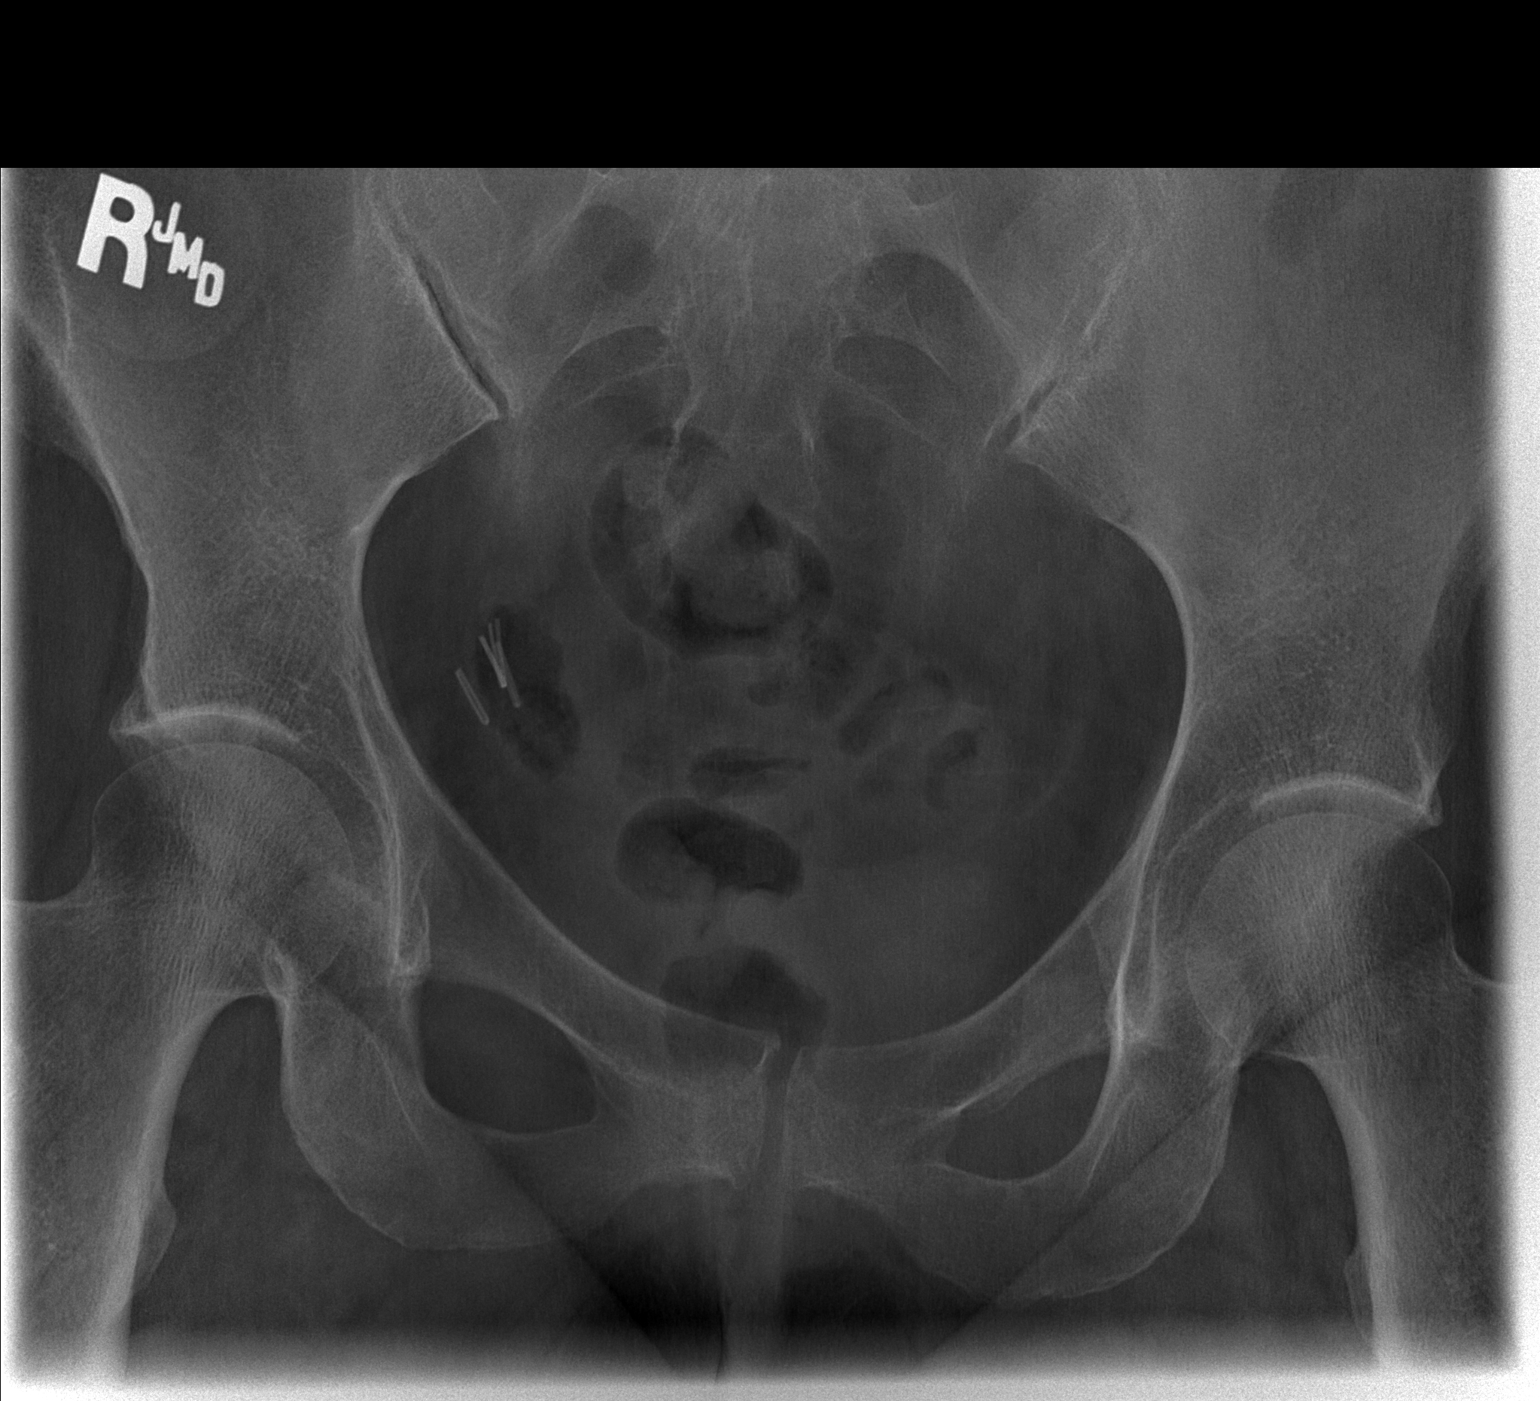

[2 of 2 positions shown; findings below may reference images not displayed]

FINDINGS: There are 2 ill-defined opacities overlying expected location of the
mid aspect the right ureter with dominant opacity measuring
approximately 0.6 cm, potentially ingested radiopaque debris though
conceivably a ureteral stone could have a similar appearance.

There is a ill-defined punctate (approximately 0.7 cm) opacity
overlying expected location of the inferior pole the right renal
fossa, potentially representative of nephrolithiasis.

No definitive abnormal opacities overlies expected location of left
renal fossa, left ureter or the urinary bladder.

Surgical clips overlie the right hemipelvis. Nonobstructive bowel
gas pattern

No acute or aggressive osseous abnormalities.
IMPRESSION: 1. Suspected right-sided ureteral stones, similar to abdominal CT
performed 11/03/2018.
2. Potential right-sided nephrolithiasis.

## 2021-09-09 ENCOUNTER — Ambulatory Visit (HOSPITAL_COMMUNITY)
Admission: EM | Admit: 2021-09-09 | Discharge: 2021-09-09 | Disposition: A | Discharge status: Home / Self Care | Payer: 59 | Attending: Family Medicine | Admitting: Family Medicine

## 2021-09-09 ENCOUNTER — Emergency Department (HOSPITAL_COMMUNITY): Payer: 59

## 2021-09-09 ENCOUNTER — Emergency Department (HOSPITAL_COMMUNITY)
Admission: EM | Admit: 2021-09-09 | Discharge: 2021-09-10 | Disposition: A | Discharge status: Home / Self Care | Payer: 59 | Attending: Emergency Medicine | Admitting: Emergency Medicine

## 2021-09-09 ENCOUNTER — Other Ambulatory Visit: Payer: Self-pay

## 2021-09-09 ENCOUNTER — Encounter (HOSPITAL_COMMUNITY): Payer: Self-pay

## 2021-09-09 ENCOUNTER — Encounter (HOSPITAL_COMMUNITY): Payer: Self-pay | Admitting: Emergency Medicine

## 2021-09-09 DIAGNOSIS — R29810 Facial weakness: Secondary | ICD-10-CM | POA: Diagnosis not present

## 2021-09-09 DIAGNOSIS — Z20822 Contact with and (suspected) exposure to covid-19: Secondary | ICD-10-CM | POA: Diagnosis not present

## 2021-09-09 DIAGNOSIS — G43909 Migraine, unspecified, not intractable, without status migrainosus: Secondary | ICD-10-CM | POA: Insufficient documentation

## 2021-09-09 DIAGNOSIS — Z7982 Long term (current) use of aspirin: Secondary | ICD-10-CM | POA: Insufficient documentation

## 2021-09-09 DIAGNOSIS — R519 Headache, unspecified: Secondary | ICD-10-CM | POA: Diagnosis present

## 2021-09-09 LAB — CBC
HCT: 43.9 % (ref 36.0–46.0)
Hemoglobin: 14.9 g/dL (ref 12.0–15.0)
MCH: 32.7 pg (ref 26.0–34.0)
MCHC: 33.9 g/dL (ref 30.0–36.0)
MCV: 96.3 fL (ref 80.0–100.0)
Platelets: 270 10*3/uL (ref 150–400)
RBC: 4.56 MIL/uL (ref 3.87–5.11)
RDW: 12.4 % (ref 11.5–15.5)
WBC: 10.7 10*3/uL — ABNORMAL HIGH (ref 4.0–10.5)
nRBC: 0 % (ref 0.0–0.2)

## 2021-09-09 LAB — I-STAT CHEM 8, ED
BUN: 4 mg/dL — ABNORMAL LOW (ref 6–20)
Calcium, Ion: 1.22 mmol/L (ref 1.15–1.40)
Chloride: 106 mmol/L (ref 98–111)
Creatinine, Ser: 0.6 mg/dL (ref 0.44–1.00)
Glucose, Bld: 95 mg/dL (ref 70–99)
HCT: 45 % (ref 36.0–46.0)
Hemoglobin: 15.3 g/dL — ABNORMAL HIGH (ref 12.0–15.0)
Potassium: 3.6 mmol/L (ref 3.5–5.1)
Sodium: 143 mmol/L (ref 135–145)
TCO2: 27 mmol/L (ref 22–32)

## 2021-09-09 LAB — COMPREHENSIVE METABOLIC PANEL
ALT: 18 U/L (ref 0–44)
AST: 20 U/L (ref 15–41)
Albumin: 4.3 g/dL (ref 3.5–5.0)
Alkaline Phosphatase: 88 U/L (ref 38–126)
Anion gap: 11 (ref 5–15)
BUN: <5 mg/dL — ABNORMAL LOW (ref 6–20)
CO2: 24 mmol/L (ref 22–32)
Calcium: 9.8 mg/dL (ref 8.9–10.3)
Chloride: 106 mmol/L (ref 98–111)
Creatinine, Ser: 0.69 mg/dL (ref 0.44–1.00)
GFR, Estimated: >60 mL/min (ref >60)
Glucose, Bld: 104 mg/dL — ABNORMAL HIGH (ref 70–99)
Potassium: 3.6 mmol/L (ref 3.5–5.1)
Sodium: 141 mmol/L (ref 135–145)
Total Bilirubin: 0.5 mg/dL (ref 0.3–1.2)
Total Protein: 7.2 g/dL (ref 6.5–8.1)

## 2021-09-09 LAB — I-STAT BETA HCG BLOOD, ED (MC, WL, AP ONLY): I-stat hCG, quantitative: 8.9 m[IU]/mL — ABNORMAL HIGH (ref <5)

## 2021-09-09 LAB — DIFFERENTIAL
Abs Immature Granulocytes: 0.03 10*3/uL (ref 0.00–0.07)
Basophils Absolute: 0 10*3/uL (ref 0.0–0.1)
Basophils Relative: 0 %
Eosinophils Absolute: 0.1 10*3/uL (ref 0.0–0.5)
Eosinophils Relative: 1 %
Immature Granulocytes: 0 %
Lymphocytes Relative: 33 %
Lymphs Abs: 3.5 10*3/uL (ref 0.7–4.0)
Monocytes Absolute: 0.4 10*3/uL (ref 0.1–1.0)
Monocytes Relative: 4 %
Neutro Abs: 6.6 10*3/uL (ref 1.7–7.7)
Neutrophils Relative %: 62 %

## 2021-09-09 LAB — PROTIME-INR
INR: 1 (ref 0.8–1.2)
Prothrombin Time: 12.8 seconds (ref 11.4–15.2)

## 2021-09-09 LAB — RESP PANEL BY RT-PCR (FLU A&B, COVID) ARPGX2
Influenza A by PCR: NEGATIVE
Influenza B by PCR: NEGATIVE
SARS Coronavirus 2 by RT PCR: NEGATIVE

## 2021-09-09 LAB — APTT: aPTT: 27 seconds (ref 24–36)

## 2021-09-09 MED ORDER — METOCLOPRAMIDE HCL 5 MG/ML IJ SOLN
10.0000 mg | Freq: Once | INTRAMUSCULAR | Status: AC
  Administered 2021-09-09: 10 mg via INTRAVENOUS
  Filled 2021-09-09: qty 2

## 2021-09-09 MED ORDER — ACETAMINOPHEN 500 MG PO TABS
1000.0000 mg | ORAL_TABLET | Freq: Once | ORAL | Status: AC
  Administered 2021-09-09: 1000 mg via ORAL
  Filled 2021-09-09: qty 2

## 2021-09-09 MED ORDER — IOHEXOL 350 MG/ML SOLN
75.0000 mL | Freq: Once | INTRAVENOUS | Status: AC | PRN
  Administered 2021-09-09: 75 mL via INTRAVENOUS

## 2021-09-09 MED ORDER — SODIUM CHLORIDE 0.9 % IV BOLUS
1000.0000 mL | Freq: Once | INTRAVENOUS | Status: AC
  Administered 2021-09-09: 1000 mL via INTRAVENOUS

## 2021-09-09 MED ORDER — DIPHENHYDRAMINE HCL 50 MG/ML IJ SOLN
12.5000 mg | Freq: Once | INTRAMUSCULAR | Status: AC
  Administered 2021-09-09: 12.5 mg via INTRAVENOUS
  Filled 2021-09-09: qty 1

## 2021-09-09 MED ORDER — SODIUM CHLORIDE 0.9% FLUSH: 3.0000 mL | Freq: Once | INTRAVENOUS | Status: DC

## 2021-09-09 MED ORDER — GADOBUTROL 1 MMOL/ML IV SOLN
8.5000 mL | Freq: Once | INTRAVENOUS | Status: AC | PRN
  Administered 2021-09-09: 8.5 mL via INTRAVENOUS

## 2021-09-09 MED ORDER — SODIUM CHLORIDE 0.9 % IV SOLN: INTRAVENOUS | Status: DC

## 2021-09-09 NOTE — ED Provider Notes (Signed)
[facility] EMERGENCY DEPARTMENT Provider Note   CSN: [id number] Arrival date & time: 09/09/21  1555     History  Chief Complaint  Patient presents with   Facial Droop    Vanessa Lamb is a 46 y.o. female.  HPI  46 year old female presenting to the emergency department with a headache and facial droop.  The patient states that last night she developed a sudden onset headache.  She had a fairly abrupt onset of facial droop that she noticed this morning.  She was last well last night.  She was seen in urgent care and referred to the emergency department for stroke work-up.  She denies any numbness or weakness in her extremities.  Her facial droop spares the forehead.  No rash.  Home Medications Prior to Admission medications   Medication Sig Start Date End Date Taking? Authorizing Provider  aspirin EC 81 MG tablet Take 81 mg by mouth daily.    [provider]  atorvastatin (LIPITOR) 40 MG tablet Take 40 mg by mouth every evening.     [provider]  Cyanocobalamin (B-12) 1000 MCG SUBL Place 1,000 mcg under the tongue 2 (two) times a week.     [provider]  HYDROcodone-acetaminophen (NORCO/VICODIN) 5-325 MG tablet Take 1 tablet by mouth every 6 (six) hours as needed for moderate pain.  04/05/19   [provider]  pantoprazole (PROTONIX) 40 MG tablet Take 40 mg by mouth every evening.     [provider]  phenazopyridine (PYRIDIUM) 200 MG tablet Take 1 tablet (200 mg total) by mouth 3 (three) times daily as needed for pain. 04/20/19   Ihor Gully, MD      Allergies    Patient has no known allergies.    Review of Systems   Review of Systems  Neurological:  Positive for facial asymmetry and headaches.  All other systems reviewed and are negative.  Physical Exam Updated Vital Signs BP (!) 105/54    Pulse 74    Temp 97.8 F (36.6 C) (Oral)    Resp 18    Ht 5\' 4"  (1.626 m)    Wt 86.2 kg    SpO2 95%    BMI 32.61 kg/m   Physical Exam Vitals and nursing note reviewed.  Constitutional:      General: She is not in acute distress.    Appearance: She is well-developed.  HENT:     Head: Normocephalic and atraumatic.  Eyes:     Conjunctiva/sclera: Conjunctivae normal.  Cardiovascular:     Rate and Rhythm: Normal rate and regular rhythm.     Heart sounds: No murmur heard. Pulmonary:     Effort: Pulmonary effort is normal. No respiratory distress.     Breath sounds: Normal breath sounds.  Abdominal:     Palpations: Abdomen is soft.     Tenderness: There is no abdominal tenderness.  Musculoskeletal:        General: No swelling.     Cervical back: Neck supple.  Skin:    General: Skin is warm and dry.     Capillary Refill: Capillary refill takes less than 2 seconds.  Neurological:     Mental Status: She is alert.     Comments: MENTAL STATUS EXAM:    Orientation: Alert and oriented to person, place and time.  Memory: Cooperative, follows commands well.  Language: Speech is clear and language is normal.   CRANIAL NERVES:    CN 2 (Optic): Visual fields intact  to confrontation.  CN 3,4,6 (EOM): Pupils equal and reactive to light. Full extraocular eye movement without nystagmus.  CN 5 (Trigeminal): Facial sensation is normal, no weakness of masticatory muscles.  CN 7 (Facial): Right-sided facial droop that spares the forehead present CN 8 (Auditory): Auditory acuity grossly normal.  CN 9,10 (Glossophar): The uvula is midline, the palate elevates symmetrically.  CN 11 (spinal access): Normal sternocleidomastoid and trapezius strength.  CN 12 (Hypoglossal): The tongue is midline. No atrophy or fasciculations.Marland Kitchen   MOTOR:  Muscle Strength: 5/5RUE, 5/5LUE, 5/5RLE, 5/5LLE.   COORDINATION:   No tremor.   SENSATION:   Intact to light touch all four extremities.   Psychiatric:        Mood and Affect: Mood normal.    ED Results / Procedures / Treatments   Labs (all labs ordered are listed, but only  abnormal results are displayed) Labs Reviewed  CBC - Abnormal; Notable for the following components:      Result Value   WBC 10.7 (*)    All other components within normal limits  COMPREHENSIVE METABOLIC PANEL - Abnormal; Notable for the following components:   Glucose, Bld 104 (*)    BUN <5 (*)    All other components within normal limits  I-STAT CHEM 8, ED - Abnormal; Notable for the following components:   BUN 4 (*)    Hemoglobin 15.3 (*)    All other components within normal limits  I-STAT BETA HCG BLOOD, ED ([facility], [facility], [facility] ONLY) - Abnormal; Notable for the following components:   I-stat hCG, quantitative 8.9 (*)    All other components within normal limits  RESP PANEL BY RT-PCR (FLU A&B, COVID) ARPGX2  PROTIME-INR  APTT  DIFFERENTIAL  HCG, QUANTITATIVE, PREGNANCY    EKG EKG Interpretation  Date/Time:  Wednesday September 09 2021 16:05:21 EST Ventricular Rate:  88 PR Interval:  148 QRS Duration: 88 QT Interval:  366 QTC Calculation: 442 R Axis:   38 Text Interpretation: Normal sinus rhythm Normal ECG When compared with ECG of 18-Mar-2015 09:43, PREVIOUS ECG IS PRESENT Confirmed by Ernie Avena ([id number]) on 09/09/2021 6:47:24 PM  Radiology MR Brain W and Wo Contrast  Result Date: 09/09/2021 CLINICAL DATA:  Stroke suspected EXAM: MRI HEAD WITHOUT AND WITH CONTRAST TECHNIQUE: Multiplanar, multiecho pulse sequences of the brain and surrounding structures were obtained without and with intravenous contrast. CONTRAST:  8.4mL GADAVIST GADOBUTROL 1 MMOL/ML IV SOLN COMPARISON:  03/18/2015. FINDINGS: Brain: No restricted diffusion to suggest acute or subacute infarct. No acute hemorrhage, mass mass effect, or midline shift. No abnormal parenchymal or meningeal enhancement. Scattered foci of T2 hyperintense signal in the bilateral frontal lobes, which have increased slightly since prior exam. No hydrocephalus or extra-axial collection. Vascular: Normal flow voids. Skull and upper cervical spine:  Normal marrow signal. Sinuses/Orbits: Mucous retention cyst in the left maxillary sinus. Otherwise negative. Other: None. IMPRESSION: 1. No acute intracranial process.  Abnormal enhancement. 2. Foci of increased T2 signal in the bilateral frontal lobes, which are nonspecific and favored to be the sequela of migraines or small vessel ischemic disease. Given the minimal change since 2016, demyelinating disease is considered less likely. Electronically Signed   By: Wiliam Ke M.D.   On: 09/09/2021 18:47    Procedures Procedures    Medications Ordered in ED Medications  sodium chloride flush (NS) 0.9 % injection 3 mL (0 mLs Intravenous Hold 09/09/21 1727)  sodium chloride 0.9 % bolus 1,000 mL (0 mLs Intravenous Stopped 09/09/21 2239)  And  0.9 %  sodium chloride infusion (has no administration in time range)  gadobutrol (GADAVIST) 1 MMOL/ML injection 8.5 mL (8.5 mLs Intravenous Contrast Given 09/09/21 1830)  metoCLOPramide (REGLAN) injection 10 mg (10 mg Intravenous Given 09/09/21 2012)  diphenhydrAMINE (BENADRYL) injection 12.5 mg (12.5 mg Intravenous Given 09/09/21 2011)  acetaminophen (TYLENOL) tablet 1,000 mg (1,000 mg Oral Given 09/09/21 2011)  iohexol (OMNIPAQUE) 350 MG/ML injection 75 mL (75 mLs Intravenous Contrast Given 09/09/21 2348)    ED Course/ Medical Decision Making/ A&P Clinical Course as of 09/10/21 0142  Thu Sep 10, 2021  0141 I personally viewed the images from radiology studies and agree with radiologist interpretation: CTV neg for thrombus  [CS]    Clinical Course User Index [CS] Pollyann Savoy, MD                           Medical Decision Making Amount and/or Complexity of Data Reviewed Labs: ordered. Radiology: ordered.  Risk OTC drugs. Prescription drug management.   46 year old female presenting to the emergency department with a headache and facial droop.  The patient states that last night she developed a sudden onset headache.  She had a fairly abrupt onset  of facial droop that she noticed this morning.  She was last well last night.  She was seen in urgent care and referred to the emergency department for stroke work-up.  She denies any numbness or weakness in her extremities.  Her facial droop spares the forehead.  No rash.  On arrival, the patient was afebrile, hemodynamically stable.  MRI imaging ordered on patient arrival and the patient obtain MRI imaging prior to CT imaging.  MRI imaging was negative for acute stroke.  The patient's physical exam was concerning for a right-sided facial droop that spares the forehead.  She has no other neurologic deficits noted.  She presents with a headache and facial droop concerning for possible complex migraine.  Discussed the case with Dr. Thomasena Edis of neurology who agreed with the plan of care.  The patient was treated with a migraine cocktail and on repeat assessment felt symptomatically improved.  She does persist with a right-sided facial droop.  I discussed the risks for venous sinus thrombosis and discussed further imaging with CT venogram which the patient agreed to obtain.  Plan at signout to follow-up CT imaging and discharge with outpatient neurology follow-up.  Signout given to Dr. Bernette Mayers at 2330.   Final Clinical Impression(s) / ED Diagnoses Final diagnoses:  Migraine without status migrainosus, not intractable, unspecified migraine type  Facial droop    Rx / DC Orders ED Discharge Orders          Ordered    Ambulatory referral to Neurology       Comments: An appointment is requested in approximately: 2 weeks   09/09/21 2321              Ernie Avena, MD 09/10/21 [phone]

## 2021-09-09 NOTE — ED Provider Notes (Signed)
?[place] ? ? ?[id number] ?09/09/21 Arrival Time: [id number] ? ?ASSESSMENT & PLAN: ? ?1. Facial droop   ?2. Acute nonintractable headache, unspecified headache type   ? ?With concern for neurological insult; h/o questionable infarct on MRI in past; was seeing neurology but stopped secondary to financial concerns. Last known well early am today. To ED for further evaluation; discussed and she agrees. ?Stable upon discharge. Elects private vehicle vs EMS. ? ?Reviewed expectations re: course of current medical issues. Questions answered. ?Outlined signs and symptoms indicating need for more acute intervention. ?Patient verbalized understanding. ?After Visit Summary given. ? ? ?SUBJECTIVE: ? ?Vanessa Lamb is a 46 y.o. female who reports fairly abrupt onset of facial droop noted this morning. Yesterday evening with "stabbing" R-sided HA that persists into today. Overall fatigued. Chart reviewed for neurological history. Ambulatory here. No extremity sensation changes or weakness. Occas blurry vision. ? ?Social History  ? ?Substance and Sexual Activity  ?Alcohol Use No  ? Alcohol/week: 0.0 standard drinks  ? ?Social History  ? ?Tobacco Use  ?Smoking Status Former  ? Packs/day: 1.00  ? Years: 10.00  ? Pack years: 10.00  ? Types: Cigarettes  ? Quit date: 07/05/2018  ? Years since quitting: 3.1  ?Smokeless Tobacco Never  ? ?Denies recreational drug use. ? ? ?OBJECTIVE: ? ?Vitals:  ? 09/09/21 1448  ?BP: (!) 136/96  ?Resp: 16  ?SpO2: 100%  ?  ?General appearance: alert; no distress ?Eyes: PERRLA; EOMI; conjunctiva normal ?HENT: normocephalic; atraumatic; nasal mucosa normal; oral mucosa normal ?Neck: supple with FROM ?Lungs: clear to auscultation bilaterally; unlabored ?Heart: regular ?Abdomen: soft, non-tender; bowel sounds normal ?Extremities: no cyanosis or edema; symmetrical with no gross deformities ?Skin: warm and dry ?Neurologic: normal gait; DTR's normal and symmetric; extremity strength normal and symmetric; with R  lower facial droop sparing forehead ?Psychological: alert and cooperative; normal mood and affect ? ? ?No Known Allergies ? ?Past Medical History:  ?Diagnosis Date  ? Aortic atherosclerosis ([place])   ? Atrophic gastritis EGD SEP 2013  ? B12 deficiency   ? Carotid stenosis   ? Depression   ? Esophagitis   ? Mild  ? Facial droop   ? Right  ? GERD (gastroesophageal reflux disease)   ? Heart palpitations   ? History of chest pain   ? History of hiatal hernia   ? Small  ? History of kidney stones   ? Hyperlipemia   ? IBS (irritable bowel syndrome) TCS SEP 2013  ? NL COLON/DUO Bx  ? Interstitial cystitis   ? Left ovarian cyst   ? Migraines   ? Obese   ? Pernicious anemia   ? Right sided weakness   ? Vascular disease   ? ?Social History  ? ?Socioeconomic History  ? Marital status: Single  ?  Spouse name: Not on file  ? Number of children: 2  ? Years of education: 76  ? Highest education level: Not on file  ?Occupational History  ? Occupation: machine op  ?  Employer: [employer]  ?Tobacco Use  ? Smoking status: Former  ?  Packs/day: 1.00  ?  Years: 10.00  ?  Pack years: 10.00  ?  Types: Cigarettes  ?  Quit date: 07/05/2018  ?  Years since quitting: 3.1  ? Smokeless tobacco: Never  ?Vaping Use  ? Vaping Use: Never used  ?Substance and Sexual Activity  ? Alcohol use: No  ?  Alcohol/week: 0.0 standard drinks  ? Drug use: No  ?  Sexual activity: Never  ?  Birth control/protection: Surgical  ?Other Topics Concern  ? Not on file  ?Social History Narrative  ? Patient is single. And  lives at home with her children.   ? Patient works full time At [employer].  ? Education high school.  ? Left handed.  ? Caffeine 6 drinks daily.  ?   ?   ? ?Social Determinants of Health  ? ?Financial Resource Strain: Not on file  ?Food Insecurity: Not on file  ?Transportation Needs: Not on file  ?Physical Activity: Not on file  ?Stress: Not on file  ?Social Connections: Not on file  ?Intimate Partner Violence: Not on file  ? ?Family History   ?Problem Relation Age of Onset  ? Colon cancer Maternal Grandfather 19  ? Colonic polyp Son 9  ? Hypertension Mother   ? Thyroid cancer Mother   ? Hypertension Father   ? Ulcers Father   ?     PUD  ? Diabetes Father   ? ?Past Surgical History:  ?Procedure Laterality Date  ? ABDOMINAL HYSTERECTOMY  2005  ? complete  ? COLONOSCOPY  03/15/2012  ? Procedure: COLONOSCOPY;  Surgeon: Danie Binder, MD;  [facility];  Service: Endoscopy;  Laterality: N/A;  10:30 AM  ? CYSTOSCOPY/URETEROSCOPY/HOLMIUM LASER/STENT PLACEMENT Right 04/20/2019  ? Procedure: CYSTOSCOPY/RETROGRADE/URETEROSCOPY/STONE BASKETRY;  Surgeon: Kathie Rhodes, MD;  [facility]: [facility];  Service: Urology;  Laterality: Right;  ? ESOPHAGOGASTRODUODENOSCOPY  03/15/2012  ? Procedure: ESOPHAGOGASTRODUODENOSCOPY (EGD);  Surgeon: Danie Binder, MD;  [facility];  Service: Endoscopy;  Laterality: N/A;  ? TONSILLECTOMY    ? TUBAL LIGATION    ? URETEROSCOPY WITH HOLMIUM LASER LITHOTRIPSY Right 12/08/2018  ? Procedure: URETEROSCOPY  STONE BASKETING;  Surgeon: Kathie Rhodes, MD;  Location: [facility];  Service: Urology;  Laterality: Right;  ? WISDOM TOOTH EXTRACTION    ? ? ? ?  ?Vanessa Kick, MD ?09/09/21 1746 ? ?

## 2021-09-09 NOTE — ED Triage Notes (Signed)
Pt woke up last night with a horrible headache. She reports some right side face droop that started this morning. She has feeling fatigue all day.  ?

## 2021-09-09 NOTE — ED Notes (Signed)
Pt to imaging

## 2021-09-09 NOTE — ED Notes (Signed)
Pt returned from MRI °

## 2021-09-09 NOTE — ED Provider Triage Note (Signed)
Emergency Medicine Provider Triage Evaluation Note ? ?Vanessa Lamb , a 46 y.o. female  was evaluated in triage.  Pt complains of sharp stabbing headache that began in the middle the night last night, followed by right-sided facial droop, generalized feeling of weakness.  Patient reports that she had been previously evaluated in neurology in a wheelchair back in 2016, was diagnosed with some nonspecific white matter lesion, but not MS.  She had resolution of headache, migraine symptoms for many years until today.  Endorses history of high blood pressure, smokes tobacco, no history of ACS or previous stroke. ? ?Review of Systems  ?Positive: Facial droop, fatigue ?Negative: Unilateral arm / leg weakness, dysarthria, confusion ? ?Physical Exam  ?BP (!) 155/80   Pulse 87   Temp 97.8 ?F (36.6 ?C) (Oral)   Resp 18   SpO2 100%  ?Gen:   Awake, no distress   ?Resp:  Normal effort  ?MSK:   Moves extremities without difficulty  ?Other:  Right sided facial droop noted, questionable sparing of the forehead muscles, intact strength 5/5 throughout, no dysarthria, overall normal coordination ? ?Medical Decision Making  ?Medically screening exam initiated at 4:19 PM.  Appropriate orders placed.  Vanessa Lamb was informed that the remainder of the evaluation will be completed by another provider, this initial triage assessment does not replace that evaluation, and the importance of remaining in the ED until their evaluation is complete. ? ?Patient evaluated by Dr. Rhunette Croft in triage.  Ordered CT angio head neck, and MRI afterwards.  Patient outside of window because last known well was around 7 or 8 AM.  No LVO suspected.  Code stroke not initiated. ?  ?Olene Floss, PA-C ?09/09/21 1623 ? ?

## 2021-09-09 NOTE — ED Triage Notes (Signed)
Pt sent here from [facility] with c/o bad h/a last night and difficulty walking , the up this morning around 530 then noticed some right side facial droop and eye droop around 7am , still present on arrival Md in to see pt in triage  ?

## 2021-09-09 NOTE — Discharge Instructions (Addendum)
Please schedule follow-up with neurology in clinic for migraine headaches. Your symptoms today are consistent with complex migraine. ?

## 2021-09-10 NOTE — ED Provider Notes (Signed)
Care of the patient assumed at the change of shift. Here for headache and facial droop. Bell's palsy vs complex migraine. MRI was neg. Awaiting CTV to eval central venous thrombus.  ?Physical Exam  ?BP (!) 105/54   Pulse 74   Temp 97.8 ?F (36.6 ?C) (Oral)   Resp 18   Ht 5\' 4"  (1.626 m)   Wt 86.2 kg   SpO2 95%   BMI 32.61 kg/m?  ? ?Physical Exam ? ?Procedures  ?Procedures ? ?ED Course / MDM  ? ?Clinical Course as of 09/10/21 0150  ?Thu Sep 10, 2021  ?0141 I personally viewed the images from radiology studies and agree with radiologist interpretation: CTV neg for thrombus ? [CS]  ?  ?Clinical Course User Index ?[CS] Truddie Hidden, MD  ? ?Medical Decision Making ?Amount and/or Complexity of Data Reviewed ?Labs: ordered. ?Radiology: ordered. ? ?Risk ?OTC drugs. ?Prescription drug management. ? ? ? ? ? ? ? ?  ?Truddie Hidden, MD ?09/10/21 0234 ? ?

## 2021-12-07 ENCOUNTER — Other Ambulatory Visit (HOSPITAL_COMMUNITY): Payer: Self-pay | Admitting: Family Medicine

## 2021-12-07 DIAGNOSIS — Z1231 Encounter for screening mammogram for malignant neoplasm of breast: Secondary | ICD-10-CM

## 2022-02-25 ENCOUNTER — Encounter: Payer: Self-pay | Admitting: *Deleted

## 2022-03-20 NOTE — Progress Notes (Unsigned)
Referring Provider:*** Primary Care Physician:  Elfredia Nevins, MD Primary Gastroenterologist:  Dr. Bonnetta Barry chief complaint on file.   HPI:   Vanessa Lamb is a 46 y.o. female with presenting today at the request of Dr. Sherwood Gambler for consult colonoscopy, abdominal pain, diarrhea, gas.  We have not seen patient since 2013.  She reported self diagnosed IBS at that time with diarrhea and painful gas.  She was having worsening symptoms.  She had been admitted in August and had a CT that showed colitis of the descending and sigmoid colon and was treated with Cipro and Flagyl but she continued with problems with 3 loose mucousy bowel movements daily.  Also with indigestion, nausea with every meal.  She was scheduled for EGD and colonoscopy.  Procedures completed 03/15/2012: EGD: Small hiatal hernia, mild esophagitis, nonerosive gastritis.  Duodenal biopsies benign.  Gastric biopsy with chronic atrophic gastritis with focal intestinal metaplasia. Colonoscopy: Normal colonic mucosa and TI, small internal hemorrhoids.  Colon biopsies were benign.  Today:   Past Medical History:  Diagnosis Date   Aortic atherosclerosis ([facility])    Atrophic gastritis EGD SEP 2013   B12 deficiency    Carotid stenosis    Depression    Esophagitis    Mild   Facial droop    Right   GERD (gastroesophageal reflux disease)    Heart palpitations    History of chest pain    History of hiatal hernia    Small   History of kidney stones    Hyperlipemia    IBS (irritable bowel syndrome) TCS SEP 2013   NL COLON/DUO Bx   Interstitial cystitis    Left ovarian cyst    Migraines    Obese    Pernicious anemia    Right sided weakness    Vascular disease     Past Surgical History:  Procedure Laterality Date   ABDOMINAL HYSTERECTOMY  2005   complete   COLONOSCOPY  03/15/2012   Procedure: COLONOSCOPY;  Surgeon: West Bali, MD;  [facility];  Service: Endoscopy;  Laterality: N/A;  10:30 AM    CYSTOSCOPY/URETEROSCOPY/HOLMIUM LASER/STENT PLACEMENT Right 04/20/2019   Procedure: CYSTOSCOPY/RETROGRADE/URETEROSCOPY/STONE BASKETRY;  Surgeon: Ihor Gully, MD;  [facility];  Service: Urology;  Laterality: Right;   ESOPHAGOGASTRODUODENOSCOPY  03/15/2012   Procedure: ESOPHAGOGASTRODUODENOSCOPY (EGD);  Surgeon: West Bali, MD;  [facility];  Service: Endoscopy;  Laterality: N/A;   TONSILLECTOMY     TUBAL LIGATION     URETEROSCOPY WITH HOLMIUM LASER LITHOTRIPSY Right 12/08/2018   Procedure: URETEROSCOPY  STONE BASKETING;  Surgeon: Ihor Gully, MD;  [facility];  Service: Urology;  Laterality: Right;   WISDOM TOOTH EXTRACTION      Current Outpatient Medications  Medication Sig Dispense Refill   aspirin EC 81 MG tablet Take 81 mg by mouth daily.     atorvastatin (LIPITOR) 40 MG tablet Take 40 mg by mouth every evening.      Cyanocobalamin (B-12) 1000 MCG SUBL Place 1,000 mcg under the tongue 2 (two) times a week.      HYDROcodone-acetaminophen (NORCO/VICODIN) 5-325 MG tablet Take 1 tablet by mouth every 6 (six) hours as needed for moderate pain.      pantoprazole (PROTONIX) 40 MG tablet Take 40 mg by mouth every evening.      phenazopyridine (PYRIDIUM) 200 MG tablet Take 1 tablet (200 mg total) by mouth 3 (three) times daily as needed for pain. 10 tablet 0  No current facility-administered medications for this visit.    Allergies as of 03/22/2022   (No Known Allergies)    Family History  Problem Relation Age of Onset   Colon cancer Maternal Grandfather 53   Colonic polyp Son 83   Hypertension Mother    Thyroid cancer Mother    Hypertension Father    Ulcers Father        PUD   Diabetes Father     Social History   Socioeconomic History   Marital status: Single    Spouse name: Not on file   Number of children: 2   Years of education: 12   Highest education level: Not on file  Occupational History   Occupation: Social worker op     Employer: [employer]  Tobacco Use   Smoking status: Former    Packs/day: 1.00    Years: 10.00    Total pack years: 10.00    Types: Cigarettes    Quit date: 07/05/2018    Years since quitting: 3.7   Smokeless tobacco: Never  Vaping Use   Vaping Use: Never used  Substance and Sexual Activity   Alcohol use: No    Alcohol/week: 0.0 standard drinks of alcohol   Drug use: No   Sexual activity: Never    Birth control/protection: Surgical  Other Topics Concern   Not on file  Social History Narrative   Patient is single. And  lives at home with her children.    Patient works full time At [employer].   Education high school.   Left handed.   Caffeine 6 drinks daily.         Social Determinants of Health   Financial Resource Strain: Not on file  Food Insecurity: Not on file  Transportation Needs: Not on file  Physical Activity: Not on file  Stress: Not on file  Social Connections: Not on file  Intimate Partner Violence: Not on file    Review of Systems: Gen: Denies any fever, chills, cold or flulike symptoms, presyncope, syncope. CV: Denies chest pain, heart palpitations.  Resp: Denies shortness of breath, cough.  GI: See HPI GU : Denies urinary burning, urinary frequency, urinary hesitancy MS: Denies joint pain. Derm: Denies rash. Psych: Denies depression, anxiety. Heme: See HPI  Physical Exam: There were no vitals taken for this visit. General:   Alert and oriented. Pleasant and cooperative. Well-nourished and well-developed.  Head:  Normocephalic and atraumatic. Eyes:  Without icterus, sclera clear and conjunctiva pink.  Ears:  Normal auditory acuity. Lungs:  Clear to auscultation bilaterally. No wheezes, rales, or rhonchi. No distress.  Heart:  S1, S2 present without murmurs appreciated.  Abdomen:  +BS, soft, non-tender and non-distended. No HSM noted. No guarding or rebound. No masses appreciated.  Rectal:  Deferred  Msk:  Symmetrical without gross  deformities. Normal posture. Extremities:  Without edema. Neurologic:  Alert and  oriented x4;  grossly normal neurologically. Skin:  Intact without significant lesions or rashes. Psych:  Normal mood and affect.    Assessment:     Plan:  ***   Aliene Altes, PA-C [facility] 03/22/2022

## 2022-03-22 ENCOUNTER — Encounter: Payer: Self-pay | Admitting: Gastroenterology

## 2022-03-22 ENCOUNTER — Ambulatory Visit: Payer: 59 | Admitting: Gastroenterology

## 2022-03-22 ENCOUNTER — Telehealth: Payer: Self-pay | Admitting: *Deleted

## 2022-03-22 VITALS — BP 122/78 | HR 82 | Temp 98.0°F

## 2022-03-22 DIAGNOSIS — R110 Nausea: Secondary | ICD-10-CM | POA: Diagnosis not present

## 2022-03-22 DIAGNOSIS — K219 Gastro-esophageal reflux disease without esophagitis: Secondary | ICD-10-CM | POA: Diagnosis not present

## 2022-03-22 DIAGNOSIS — R198 Other specified symptoms and signs involving the digestive system and abdomen: Secondary | ICD-10-CM | POA: Diagnosis not present

## 2022-03-22 DIAGNOSIS — R1011 Right upper quadrant pain: Secondary | ICD-10-CM | POA: Diagnosis not present

## 2022-03-22 DIAGNOSIS — Z1211 Encounter for screening for malignant neoplasm of colon: Secondary | ICD-10-CM

## 2022-03-22 NOTE — Telephone Encounter (Signed)
Called pt, LMOVM with [place] appt details.

## 2022-03-22 NOTE — Patient Instructions (Addendum)
We are arranging for you to have an ultrasound of your gallbladder at [facility].  For reflux: Take pantoprazole 40 mg daily 30 minutes before breakfast.  Set an alarm to help you remember to take this medication every morning. Take Pepcid 20 mg at bedtime. Follow a GERD diet:  Avoid fried, fatty, greasy, spicy, citrus foods. Avoid caffeine and carbonated beverages. Avoid chocolate. Try eating 4-6 small meals a day rather than 3 large meals. Do not eat within 3 hours of laying down. Prop head of bed up on wood or bricks to create a 6 inch incline.  For alternating constipation and diarrhea: This may be secondary to irritable bowel syndrome, but we will obtain stool studies to rule out infectious diarrhea and plan to proceed with a colonoscopy in the near future for further evaluation. Complete stool studies at [facility] if diarrhea returns. If stool studies are negative, I will have additional recommendations for you to manage your diarrhea. Start Benefiber 2 teaspoons daily x2 weeks, then increase to twice daily.  It was nice meeting you today!   Aliene Altes, PA-C [facility]

## 2022-03-23 ENCOUNTER — Encounter: Payer: Self-pay | Admitting: Gastroenterology

## 2022-04-05 ENCOUNTER — Ambulatory Visit (HOSPITAL_COMMUNITY)
Admission: RE | Admit: 2022-04-05 | Discharge: 2022-04-05 | Disposition: A | Discharge status: Home / Self Care | Payer: 59 | Source: Ambulatory Visit | Attending: Gastroenterology | Admitting: Gastroenterology

## 2022-04-05 DIAGNOSIS — R1011 Right upper quadrant pain: Secondary | ICD-10-CM | POA: Insufficient documentation

## 2022-04-05 DIAGNOSIS — R110 Nausea: Secondary | ICD-10-CM | POA: Diagnosis present

## 2022-04-07 ENCOUNTER — Other Ambulatory Visit: Payer: Self-pay | Admitting: *Deleted

## 2022-08-09 ENCOUNTER — Encounter: Payer: Self-pay | Admitting: *Deleted

## 2022-10-01 ENCOUNTER — Ambulatory Visit: Payer: 59 | Admitting: Cardiology

## 2022-11-05 ENCOUNTER — Encounter: Payer: Self-pay | Admitting: Internal Medicine

## 2022-11-05 ENCOUNTER — Ambulatory Visit (INDEPENDENT_AMBULATORY_CARE_PROVIDER_SITE_OTHER): Payer: 59

## 2022-11-05 ENCOUNTER — Other Ambulatory Visit
Admission: RE | Admit: 2022-11-05 | Discharge: 2022-11-05 | Disposition: A | Discharge status: Home / Self Care | Payer: 59 | Source: Ambulatory Visit | Attending: Internal Medicine | Admitting: Internal Medicine

## 2022-11-05 ENCOUNTER — Ambulatory Visit: Payer: 59 | Admitting: Internal Medicine

## 2022-11-05 VITALS — BP 110/70 | HR 80 | Ht 63.0 in | Wt 203.0 lb

## 2022-11-05 DIAGNOSIS — I6523 Occlusion and stenosis of bilateral carotid arteries: Secondary | ICD-10-CM

## 2022-11-05 DIAGNOSIS — G629 Polyneuropathy, unspecified: Secondary | ICD-10-CM

## 2022-11-05 DIAGNOSIS — I6529 Occlusion and stenosis of unspecified carotid artery: Secondary | ICD-10-CM | POA: Insufficient documentation

## 2022-11-05 DIAGNOSIS — R61 Generalized hyperhidrosis: Secondary | ICD-10-CM | POA: Insufficient documentation

## 2022-11-05 DIAGNOSIS — E785 Hyperlipidemia, unspecified: Secondary | ICD-10-CM | POA: Insufficient documentation

## 2022-11-05 DIAGNOSIS — F17210 Nicotine dependence, cigarettes, uncomplicated: Secondary | ICD-10-CM | POA: Diagnosis not present

## 2022-11-05 DIAGNOSIS — Z79899 Other long term (current) drug therapy: Secondary | ICD-10-CM | POA: Insufficient documentation

## 2022-11-05 DIAGNOSIS — R002 Palpitations: Secondary | ICD-10-CM | POA: Insufficient documentation

## 2022-11-05 DIAGNOSIS — Z7982 Long term (current) use of aspirin: Secondary | ICD-10-CM | POA: Diagnosis not present

## 2022-11-05 DIAGNOSIS — Z136 Encounter for screening for cardiovascular disorders: Secondary | ICD-10-CM

## 2022-11-05 DIAGNOSIS — G609 Hereditary and idiopathic neuropathy, unspecified: Secondary | ICD-10-CM

## 2022-11-05 LAB — T4, FREE: Free T4: 0.75 ng/dL (ref 0.61–1.12)

## 2022-11-05 LAB — TSH: TSH: 0.497 u[IU]/mL (ref 0.350–4.500)

## 2022-11-05 NOTE — Patient Instructions (Signed)
Medication Instructions:  Your physician recommends that you continue on your current medications as directed. Please refer to the Current Medication list given to you today.  *If you need a refill on your cardiac medications before your next appointment, please call your pharmacy*   Lab Work: Today:  -TSH -T4,Free -T3,Free If you have labs (blood work) drawn today and your tests are completely normal, you will receive your results only by: MyChart Message (if you have MyChart) OR A paper copy in the mail If you have any lab test that is abnormal or we need to change your treatment, we will call you to review the results.   Testing/Procedures: Lower Extremity Arterial Doppler   Follow-Up: At [facility], you and your health needs are our priority.  As part of our continuing mission to provide you with exceptional heart care, we have created designated Provider Care Teams.  These Care Teams include your primary Cardiologist (physician) and Advanced Practice Providers (APPs -  Physician Assistants and Nurse Practitioners) who all work together to provide you with the care you need, when you need it.  We recommend signing up for the patient portal called "MyChart".  Sign up information is provided on this After Visit Summary.  MyChart is used to connect with patients for Virtual Visits (Telemedicine).  Patients are able to view lab/test results, encounter notes, upcoming appointments, etc.  Non-urgent messages can be sent to your provider as well.   To learn more about what you can do with MyChart, go to [email or web address].    Your next appointment:   6 month(s)  Provider:   Luane School, MD    Other Instructions Christena Deem- Long Term Monitor Instructions   Your physician has requested you wear your ZIO patch monitor__14____days.   This is a single patch monitor.  [employer] supplies one patch monitor per enrollment.  Additional stickers are not available.   Please  do not apply patch if you will be having a Nuclear Stress Test, Echocardiogram, Cardiac CT, MRI, or Chest Xray during the time frame you would be wearing the monitor. The patch cannot be worn during these tests.  You cannot remove and re-apply the ZIO XT patch monitor.   Your ZIO patch monitor will be sent USPS Priority mail from [facility] directly to your home address. The monitor may also be mailed to a PO BOX if home delivery is not available.   It may take 3-5 days to receive your monitor after you have been enrolled.   Once you have received you monitor, please review enclosed instructions.  Your monitor has already been registered assigning a specific monitor serial # to you.   Applying the monitor   Shave hair from upper left chest.   Hold abrader disc by orange tab.  Rub abrader in 40 strokes over left upper chest as indicated in your monitor instructions.   Clean area with 4 enclosed alcohol pads .  Use all pads to assure are is cleaned thoroughly.  Let dry.   Apply patch as indicated in monitor instructions.  Patch will be place under collarbone on left side of chest with arrow pointing upward.   Rub patch adhesive wings for 2 minutes.Remove white label marked "1".  Remove white label marked "2".  Rub patch adhesive wings for 2 additional minutes.   While looking in a mirror, press and release button in center of patch.  A small green light will flash 3-4 times .  This will be  your only indicator the monitor has been turned on.     Do not shower for the first 24 hours.  You may shower after the first 24 hours.   Press button if you feel a symptom. You will hear a small click.  Record Date, Time and Symptom in the Patient Log Book.   When you are ready to remove patch, follow instructions on last 2 pages of Patient Log Book.  Stick patch monitor onto last page of Patient Log Book.   Place Patient Log Book in [place] box.  Use locking tab on box and tape box closed securely.   The [employer] and [employer] has [employer] on it.  Please place in mailbox as soon as possible.  Your physician should have your test results approximately 7 days after the monitor has been mailed back to [facility].   Call [facility] Customer Care at [phone] if you have questions regarding your ZIO XT patch monitor.  Call them immediately if you see an orange light blinking on your monitor.   If your monitor falls off in less than 4 days contact our Monitor department at [phone].  If your monitor becomes loose or falls off after 4 days call [employer] at [phone] for suggestions on securing your monitor.

## 2022-11-05 NOTE — Progress Notes (Signed)
Cardiology Office Note  Date: 11/05/2022   ID: Vanessa Lamb, DOB 02-28-1976, MRN [id number]  PCP:  Elfredia Nevins, MD  Cardiologist:  Marjo Bicker, MD Electrophysiologist:  None   Reason for Office Visit: Evaluation of palpitations and diaphoresis at the request of Dr. Carlena Sax   History of Present Illness: Vanessa Lamb is a 47 y.o. female known to have HLD was referred to cardiology clinic for evaluation of palpitations and diaphoresis.  Patient was initially referred to cardiology clinic in 2017 for evaluation of palpitations and chest pain. She underwent exercise tolerance test in 2017 that was normal. She underwent USG carotid bilateral that showed minimal carotid artery stenosis. She also had ABI that was within normal limits but she had bilateral abnormal toe brachial indices.  USG arterial Doppler ultrasound lower extremities was ordered but was never performed in 2020.  She was instructed to follow-up as needed basis per vascular surgery note.  She is here as a new patient visit for evaluation of palpitations and diaphoresis.  She was having frequent palpitations associated with diffuse diaphoresis even though the surrounding temperature was cooler. Palpitations last for a few seconds to a minute and occurs few times per week.  No angina, DOE, syncope or leg swelling.  She does have some tingling in her toes that happens frequently as well.  She resumes smoking, currently packing 1 pack/day.  Past Medical History:  Diagnosis Date   Aortic atherosclerosis (HCC)    Atrophic gastritis EGD SEP 2013   B12 deficiency    Carotid stenosis    Depression    Esophagitis    Mild   Facial droop    Right   GERD (gastroesophageal reflux disease)    Heart palpitations    History of chest pain    History of hiatal hernia    Small   History of kidney stones    Hyperlipemia    IBS (irritable bowel syndrome) TCS SEP 2013   NL COLON/DUO Bx   Interstitial cystitis    Left ovarian cyst     Migraines    Obese    Pernicious anemia    Right sided weakness    Vascular disease     Past Surgical History:  Procedure Laterality Date   ABDOMINAL HYSTERECTOMY  07/06/2003   complete   COLONOSCOPY  03/15/2012   Surgeon: West Bali, MD;  Normal colonic mucosa and TI, small internal hemorrhoids.  Colon biopsies were benign.   CYSTOSCOPY/URETEROSCOPY/HOLMIUM LASER/STENT PLACEMENT Right 04/20/2019   Procedure: CYSTOSCOPY/RETROGRADE/URETEROSCOPY/STONE BASKETRY;  Surgeon: Ihor Gully, MD;  Location: [facility];  Service: Urology;  Laterality: Right;   ESOPHAGOGASTRODUODENOSCOPY  03/15/2012   Surgeon: West Bali, MD; Small hiatal hernia, mild esophagitis, nonerosive gastritis.  Duodenal biopsies benign.  Gastric biopsy with chronic atrophic gastritis with focal intestinal metaplasia.   TONSILLECTOMY     TUBAL LIGATION     URETEROSCOPY WITH HOLMIUM LASER LITHOTRIPSY Right 12/08/2018   Procedure: URETEROSCOPY  STONE BASKETING;  Surgeon: Ihor Gully, MD;  Location: [facility];  Service: Urology;  Laterality: Right;   WISDOM TOOTH EXTRACTION      Current Outpatient Medications  Medication Sig Dispense Refill   aspirin EC 81 MG tablet Take 81 mg by mouth daily.     atorvastatin (LIPITOR) 40 MG tablet Take 40 mg by mouth every evening.      buPROPion (WELLBUTRIN XL) 150 MG 24 hr tablet Take 150 mg by mouth daily.     pantoprazole (PROTONIX)  40 MG tablet Take 40 mg by mouth every evening.      No current facility-administered medications for this visit.   Allergies:  Patient has no known allergies.   Social History: The patient  reports that she has been smoking cigarettes. She has a 10.00 pack-year smoking history. She has never used smokeless tobacco. She reports that she does not drink alcohol and does not use drugs.   Family History: The patient's family history includes Colon cancer (age of onset: 80) in her maternal grandfather; Colonic polyp (age of onset:  45) in her son; Diabetes in her father; Hypertension in her father and mother; Thyroid cancer in her mother; Ulcers in her father.   ROS:  Please see the history of present illness. Otherwise, complete review of systems is positive for none.  All other systems are reviewed and negative.   Physical Exam: VS:  BP 110/70   Pulse 80   Ht 5\' 3"  (1.6 m)   Wt 203 lb (92.1 kg)   SpO2 99%   BMI 35.96 kg/m , BMI Body mass index is 35.96 kg/m.  Wt Readings from Last 3 Encounters:  11/05/22 203 lb (92.1 kg)  09/09/21 190 lb (86.2 kg)  04/20/19 189 lb 4 oz (85.8 kg)    General: Patient appears comfortable at rest. HEENT: Conjunctiva and lids normal, oropharynx clear with moist mucosa. Neck: Supple, no elevated JVP or carotid bruits, no thyromegaly. Lungs: Clear to auscultation, nonlabored breathing at rest. Cardiac: Regular rate and rhythm, no S3 or significant systolic murmur, no pericardial rub. Abdomen: Soft, nontender, no hepatomegaly, bowel sounds present, no guarding or rebound. Extremities: No pitting edema, distal pulses 2+. Skin: Warm and dry. Musculoskeletal: No kyphosis. Neuropsychiatric: Alert and oriented x3, affect grossly appropriate.  Recent Labwork: 11/05/2022: TSH 0.497  No results found for: "CHOL", "TRIG", "HDL", "CHOLHDL", "VLDL", "LDLCALC", "LDLDIRECT"   Assessment and Plan: Patient is a 47 year old F known to have HLD, nicotine abuse was referred to cardiology clinic for evaluation of palpitations and diffuse diaphoresis.  # Palpitations # Diffuse diaphoresis -Obtain TSH, T3 and T4 -Obtain 2-week event monitor  # Mild carotid artery stenosis -USG of bilateral carotids in 2017 and 2020 showed mild carotid artery stenosis bilaterally. Will repeat in a couple of years. Continue aspirin 81 mg once daily and atorvastatin 40 mg nightly.  # Abnormal toe brachial indicis # Peripheral neuropathy -Obtain ultrasound arterial Doppler lower extremities  # Nicotine  abuse -Smokes 1 pack/day.  Smoking cessation counseling provided. Smoking cessation instruction/counseling given:  counseled patient on the dangers of tobacco use, advised patient to stop smoking, and reviewed strategies to maximize success   # HLD: Continue atorvastatin 40 mg nightly.  I have spent a total of 45 minutes with patient reviewing chart, EKGs, labs and examining patient as well as establishing an assessment and plan that was discussed with the patient.  > 50% of time was spent in direct patient care.    Medication Adjustments/Labs and Tests Ordered: Current medicines are reviewed at length with the patient today.  Concerns regarding medicines are outlined above.   Tests Ordered: Orders Placed This Encounter  Procedures   US ARTERIAL LOWER EXTREMITY DUPLEX BILATERAL   TSH+T4F+T3Free   LONG TERM MONITOR (3-14 DAYS)   EKG 12-Lead    Medication Changes: No orders of the defined types were placed in this encounter.   Disposition:  Follow up  6 months  Signed, Leilanee Righetti Verne Spurr, MD, 11/05/2022 2:20 PM    [facility] [address], [place], [place] [address]

## 2022-11-06 LAB — T3, FREE: T3, Free: 3.7 pg/mL (ref 2.0–4.4)

## 2022-11-10 ENCOUNTER — Telehealth: Payer: Self-pay

## 2022-11-10 NOTE — Telephone Encounter (Signed)
-----   Message from Marjo Bicker, MD sent at 11/09/2022  9:37 AM EDT ----- Normal thyroid function tests

## 2022-11-10 NOTE — Telephone Encounter (Signed)
Patient notified via MyChart. PCP copied.  

## 2022-11-19 ENCOUNTER — Ambulatory Visit (HOSPITAL_COMMUNITY)
Admission: RE | Admit: 2022-11-19 | Discharge: 2022-11-19 | Disposition: A | Discharge status: Home / Self Care | Payer: 59 | Source: Ambulatory Visit | Attending: Internal Medicine | Admitting: Internal Medicine

## 2022-11-19 DIAGNOSIS — G629 Polyneuropathy, unspecified: Secondary | ICD-10-CM | POA: Diagnosis present

## 2022-12-01 ENCOUNTER — Telehealth: Payer: Self-pay

## 2022-12-01 NOTE — Telephone Encounter (Signed)
-----   Message from Marjo Bicker, MD sent at 11/30/2022 12:38 PM EDT ----- No evidence of blockages in the lower extremity blood vessels.

## 2022-12-01 NOTE — Telephone Encounter (Signed)
Patient notified via My Chart

## 2023-05-13 ENCOUNTER — Ambulatory Visit: Payer: 59 | Admitting: Internal Medicine

## 2023-12-24 ENCOUNTER — Ambulatory Visit: Admission: EM | Admit: 2023-12-24 | Discharge: 2023-12-24 | Disposition: A | Discharge status: Home / Self Care

## 2023-12-24 DIAGNOSIS — H8111 Benign paroxysmal vertigo, right ear: Secondary | ICD-10-CM

## 2023-12-24 MED ORDER — MECLIZINE HCL 12.5 MG PO TABS: 12.5000 mg | ORAL_TABLET | Freq: Three times a day (TID) | ORAL | 0 refills | Status: AC | PRN

## 2023-12-24 MED ORDER — ONDANSETRON 4 MG PO TBDP: 4.0000 mg | ORAL_TABLET | Freq: Three times a day (TID) | ORAL | 0 refills | Status: AC | PRN

## 2023-12-24 NOTE — ED Provider Notes (Signed)
 [facility]    CSN: [id number] Arrival date & time: 12/24/23  9071      History   Chief Complaint Chief Complaint  Patient presents with   Ear Fullness   Dizziness    HPI Vanessa Lamb is a 48 y.o. female.   Patient presents to urgent care for evaluation of bilateral ear fullness that started approximately 1 week ago and dizziness/intermittent nausea without vomiting that started yesterday.  Dizziness feels like a room spinning sensation and only happens when she moves her head at her neck to the right or when she is laying down in the supine position.  When she changes positions in the bed from side-to-side, she becomes very dizzy and nauseous.  Sitting upright helps some with the dizziness.  Denies associated headache, visual disturbance, paresthesias to the extremities, neck pain, fever, chills, chest pain, shortness of breath, heart palpitations, syncope, diarrhea, urinary symptoms, and rash.  Denies tinnitus and drainage from the ears.  No history of stroke or vertigo. No changes in gait, speech, or cognition/memory. She has not attempted use of any OTC medications PTA for symptoms.    Ear Fullness  Dizziness   Past Medical History:  Diagnosis Date   Aortic atherosclerosis (HCC)    Atrophic gastritis EGD SEP 2013   B12 deficiency    Carotid stenosis    Depression    Esophagitis    Mild   Facial droop    Right   GERD (gastroesophageal reflux disease)    Heart palpitations    History of chest pain    History of hiatal hernia    Small   History of kidney stones    Hyperlipemia    IBS (irritable bowel syndrome) TCS SEP 2013   NL COLON/DUO Bx   Interstitial cystitis    Left ovarian cyst    Migraines    Obese    Pernicious anemia    Right sided weakness    Vascular disease     Patient Active Problem List   Diagnosis Date Noted   Carotid artery stenosis 11/05/2022   Palpitations 11/05/2022   Diaphoresis 11/05/2022   Peripheral neuropathy  11/05/2022   Colon cancer screening 03/22/2022   Alternating constipation and diarrhea 03/22/2022   RUQ pain 03/22/2022   Atypical migraine 03/14/2015   White matter abnormality on MRI of brain 03/14/2015   B12 deficiency 03/14/2015   Tobacco dependence 03/14/2015   Depression 11/02/2013   Migraines 11/02/2013   Nausea without vomiting 03/09/2012   Chronic diarrhea 03/09/2012   Tobacco abuse 02/28/2012   Acute colitis 02/27/2012   Hyperlipidemia 02/27/2012   Obesity 02/27/2012   Gastroesophageal reflux disease 02/27/2012    Past Surgical History:  Procedure Laterality Date   ABDOMINAL HYSTERECTOMY  07/06/2003   complete   COLONOSCOPY  03/15/2012   Surgeon: Margo LITTIE Haddock, MD;  Normal colonic mucosa and TI, small internal hemorrhoids.  Colon biopsies were benign.   CYSTOSCOPY/URETEROSCOPY/HOLMIUM LASER/STENT PLACEMENT Right 04/20/2019   Procedure: CYSTOSCOPY/RETROGRADE/URETEROSCOPY/STONE BASKETRY;  Surgeon: Ottelin, Mark, MD;  Location: [facility];  Service: Urology;  Laterality: Right;   ESOPHAGOGASTRODUODENOSCOPY  03/15/2012   Surgeon: Margo LITTIE Haddock, MD; Small hiatal hernia, mild esophagitis, nonerosive gastritis.  Duodenal biopsies benign.  Gastric biopsy with chronic atrophic gastritis with focal intestinal metaplasia.   TONSILLECTOMY     TUBAL LIGATION     URETEROSCOPY WITH HOLMIUM LASER LITHOTRIPSY Right 12/08/2018   Procedure: URETEROSCOPY  STONE BASKETING;  Surgeon: Ottelin, Mark, MD;  Location: [facility];  Service: Urology;  Laterality: Right;   WISDOM TOOTH EXTRACTION      OB History   No obstetric history on file.      Home Medications    Prior to Admission medications   Medication Sig Start Date End Date Taking? Authorizing Provider  aspirin EC 81 MG tablet Take 81 mg by mouth daily.   Yes [provider]  atorvastatin (LIPITOR) 40 MG tablet Take 40 mg by mouth every evening.    Yes [provider]  buPROPion  (WELLBUTRIN  XL)  150 MG 24 hr tablet Take 150 mg by mouth daily. 03/07/22  Yes [provider]  folic acid (FOLVITE) 1 MG tablet Take 1 mg by mouth daily. 11/23/23  Yes [provider]  meclizine (ANTIVERT) 12.5 MG tablet Take 1 tablet (12.5 mg total) by mouth 3 (three) times daily as needed for dizziness. 12/24/23  Yes Enedelia Dorna HERO, FNP  ondansetron  (ZOFRAN -ODT) 4 MG disintegrating tablet Take 1 tablet (4 mg total) by mouth every 8 (eight) hours as needed for nausea or vomiting. 12/24/23  Yes Enedelia Dorna HERO, FNP  pantoprazole  (PROTONIX ) 40 MG tablet Take 40 mg by mouth every evening.    Yes [provider]    Family History Family History  Problem Relation Age of Onset   Colon cancer Maternal Grandfather 20   Colonic polyp Son 43   Hypertension Mother    Thyroid  cancer Mother    Hypertension Father    Ulcers Father        PUD   Diabetes Father     Social History Social History   Tobacco Use   Smoking status: Every Day    Current packs/day: 0.00    Average packs/day: 1 pack/day for 10.0 years (10.0 ttl pk-yrs)    Types: Cigarettes    Start date: 07/05/2008    Last attempt to quit: 07/05/2018    Years since quitting: 5.4   Smokeless tobacco: Never  Vaping Use   Vaping status: Never Used  Substance Use Topics   Alcohol use: No    Alcohol/week: 0.0 standard drinks of alcohol   Drug use: No     Allergies   Patient has no known allergies.   Review of Systems Review of Systems  Neurological:  Positive for dizziness.  Per HPI   Physical Exam Triage Vital Signs ED Triage Vitals  Encounter Vitals Group     BP 12/24/23 0940 134/82     Girls Systolic BP Percentile --      Girls Diastolic BP Percentile --      Boys Systolic BP Percentile --      Boys Diastolic BP Percentile --      Pulse Rate 12/24/23 0940 88     Resp 12/24/23 0940 16     Temp 12/24/23 0940 97.9 F (36.6 C)     Temp Source 12/24/23 0940 Oral     SpO2 12/24/23 0940 97 %     Weight  --      Height --      Head Circumference --      Peak Flow --      Pain Score 12/24/23 0941 0     Pain Loc --      Pain Education --      Exclude from Growth Chart --    No data found.  Updated Vital Signs BP 134/82 (BP Location: Right Arm)   Pulse 88   Temp 97.9 F (36.6 C) (Oral)  Resp 16   SpO2 97%   Visual Acuity Right Eye Distance:   Left Eye Distance:   Bilateral Distance:    Right Eye Near:   Left Eye Near:    Bilateral Near:     Physical Exam Vitals and nursing note reviewed.  Constitutional:      Appearance: She is not ill-appearing or toxic-appearing.  HENT:     Head: Normocephalic and atraumatic.     Right Ear: Hearing, ear canal and external ear normal. No drainage, swelling or tenderness. A middle ear effusion is present. Tympanic membrane is not perforated, erythematous, retracted or bulging.     Left Ear: Hearing, ear canal and external ear normal. No drainage, swelling or tenderness. A middle ear effusion is present. Tympanic membrane is not perforated, erythematous, retracted or bulging.     Nose: Nose normal.     Mouth/Throat:     Lips: Pink.     Mouth: Mucous membranes are moist. No injury or oral lesions.     Dentition: Normal dentition.     Tongue: No lesions.     Pharynx: Oropharynx is clear. Uvula midline. No pharyngeal swelling, oropharyngeal exudate, posterior oropharyngeal erythema, uvula swelling or postnasal drip.     Tonsils: No tonsillar exudate.   Eyes:     General: Lids are normal. Vision grossly intact. Gaze aligned appropriately.     Extraocular Movements: Extraocular movements intact.     Conjunctiva/sclera: Conjunctivae normal.   Neck:     Trachea: Trachea and phonation normal.     Comments: Dizziness elicited with head movement to the right. Cardiovascular:     Rate and Rhythm: Normal rate and regular rhythm.     Heart sounds: Normal heart sounds, S1 normal and S2 normal.  Pulmonary:     Effort: Pulmonary effort is  normal. No respiratory distress.     Breath sounds: Normal breath sounds and air entry.   Musculoskeletal:     Cervical back: Full passive range of motion without pain, normal range of motion and neck supple. No pain with movement, spinous process tenderness or muscular tenderness.  Lymphadenopathy:     Cervical: No cervical adenopathy.   Skin:    General: Skin is warm and dry.     Capillary Refill: Capillary refill takes less than 2 seconds.     Findings: No rash.   Neurological:     General: No focal deficit present.     Mental Status: She is alert and oriented to person, place, and time. Mental status is at baseline.     GCS: GCS eye subscore is 4. GCS verbal subscore is 5. GCS motor subscore is 6.     Cranial Nerves: Cranial nerves 2-12 are intact. No dysarthria or facial asymmetry.     Sensory: Sensation is intact.     Motor: Motor function is intact. No weakness, tremor, abnormal muscle tone or pronator drift.     Coordination: Coordination is intact. Romberg sign negative. Coordination normal. Finger-Nose-Finger Test normal.     Gait: Gait is intact.     Comments: Strength and sensation intact to bilateral upper and lower extremities (5/5). Moves all 4 extremities with normal coordination voluntarily. Non-focal neuro exam.   Psychiatric:        Mood and Affect: Mood normal.        Speech: Speech normal.        Behavior: Behavior normal.        Thought Content: Thought content normal.  Judgment: Judgment normal.      UC Treatments / Results  Labs (all labs ordered are listed, but only abnormal results are displayed) Labs Reviewed - No data to display  EKG   Radiology No results found.  Procedures Procedures (including critical care time)  Medications Ordered in UC Medications - No data to display  Initial Impression / Assessment and Plan / UC Course  I have reviewed the triage vital signs and the nursing notes.  Pertinent labs & imaging results that  were available during my care of the patient were reviewed by me and considered in my medical decision making (see chart for details).   1. Benign paroxysmal positional vertigo of right ear Evaluation suggests symptoms are due to vertigo.  Neuro exam normal, no focal deficit.  Will trial use of meclizine, drowsiness precautions discussed. Epley maneuvers discussed. Recommend increased fluid intake to promote hydration, appears well hydrated on exam.  Tolerating fluids, therefore candidate for oral hydration with water  and Pedialyte.  Recommend supportive care for symptomatic relief as outlined in AVS.   Counseled patient on potential for adverse effects with medications prescribed/recommended today, strict ER and return-to-clinic precautions discussed, patient verbalized understanding.    Final Clinical Impressions(s) / UC Diagnoses   Final diagnoses:  Benign paroxysmal positional vertigo of right ear     Discharge Instructions      You have been evaluated today for dizziness. Your evaluation suggests that your symptoms are most likely due to vertigo.  You have been prescribed meclizine to help relieve your symptoms. This medicine can make you sleepy, so do not take this and drive, drink alcohol, or go to work. Please take your prescription as directed.   Please follow up with your primary care provider for further management. Drink plenty of water  to stay well hydrated.  If your symptoms do not improve in the next 2-3 days with interventions, please return. Return immediately for worsening or uncontrolled symptoms, worsening headache, chest pain, shortness of breath, persistent vomiting, vision changes, fainting, or for any other concerning symptoms.  I hope you feel better!    ED Prescriptions     Medication Sig Dispense Auth. Provider   meclizine (ANTIVERT) 12.5 MG tablet Take 1 tablet (12.5 mg total) by mouth 3 (three) times daily as needed for dizziness. 30 tablet Enedelia Dorna HERO, FNP   ondansetron  (ZOFRAN -ODT) 4 MG disintegrating tablet Take 1 tablet (4 mg total) by mouth every 8 (eight) hours as needed for nausea or vomiting. 20 tablet Enedelia Dorna HERO, FNP      PDMP not reviewed this encounter.   Enedelia Dorna HERO, [place] 12/24/23 1023

## 2023-12-24 NOTE — Discharge Instructions (Addendum)
 You have been evaluated today for dizziness. Your evaluation suggests that your symptoms are most likely due to vertigo.  You have been prescribed meclizine to help relieve your symptoms. This medicine can make you sleepy, so do not take this and drive, drink alcohol, or go to work. Please take your prescription as directed.   Please follow up with your primary care provider for further management. Drink plenty of water to stay well hydrated.  If your symptoms do not improve in the next 2-3 days with interventions, please return. Return immediately for worsening or uncontrolled symptoms, worsening headache, chest pain, shortness of breath, persistent vomiting, vision changes, fainting, or for any other concerning symptoms.  I hope you feel better!

## 2023-12-24 NOTE — ED Triage Notes (Signed)
 Dizziness when laying down states feels like the world is spinning. Ear pressure that goes down to neck on right side, that comes and goes x 1 week.

## 2024-02-01 ENCOUNTER — Ambulatory Visit: Payer: Self-pay

## 2024-07-11 ENCOUNTER — Other Ambulatory Visit (HOSPITAL_COMMUNITY): Payer: Self-pay | Admitting: Nurse Practitioner

## 2024-07-11 DIAGNOSIS — E785 Hyperlipidemia, unspecified: Secondary | ICD-10-CM

## 2024-07-23 ENCOUNTER — Ambulatory Visit (HOSPITAL_COMMUNITY)
Admission: RE | Admit: 2024-07-23 | Discharge: 2024-07-23 | Disposition: A | Discharge status: Home / Self Care | Payer: Self-pay | Source: Ambulatory Visit | Attending: Nurse Practitioner | Admitting: Nurse Practitioner

## 2024-07-23 DIAGNOSIS — E785 Hyperlipidemia, unspecified: Secondary | ICD-10-CM | POA: Insufficient documentation

## 2024-08-07 ENCOUNTER — Ambulatory Visit: Admitting: Student
